# Patient Record
Sex: Male | Born: 1935 | Race: White | Hispanic: No | Marital: Married | Smoking: Former smoker
Health system: Southern US, Community
[De-identification: ages and names within clinical notes are randomized; demographics above are authoritative.]

## PROBLEM LIST (undated history)

## (undated) DIAGNOSIS — C4490 Unspecified malignant neoplasm of skin, unspecified: Secondary | ICD-10-CM

## (undated) DIAGNOSIS — Z8619 Personal history of other infectious and parasitic diseases: Secondary | ICD-10-CM

## (undated) HISTORY — DX: Personal history of other infectious and parasitic diseases: Z86.19

## (undated) HISTORY — DX: Unspecified malignant neoplasm of skin, unspecified: C44.90

---
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
## 2004-04-06 ENCOUNTER — Emergency Department (HOSPITAL_COMMUNITY): Admission: EM | Admit: 2004-04-06 | Discharge: 2004-04-06 | Payer: Self-pay | Admitting: Emergency Medicine

## 2011-06-25 DIAGNOSIS — D485 Neoplasm of uncertain behavior of skin: Secondary | ICD-10-CM | POA: Diagnosis not present

## 2011-06-25 DIAGNOSIS — L821 Other seborrheic keratosis: Secondary | ICD-10-CM | POA: Diagnosis not present

## 2011-06-25 DIAGNOSIS — L570 Actinic keratosis: Secondary | ICD-10-CM | POA: Diagnosis not present

## 2011-12-16 DIAGNOSIS — Z23 Encounter for immunization: Secondary | ICD-10-CM | POA: Diagnosis not present

## 2011-12-25 DIAGNOSIS — D485 Neoplasm of uncertain behavior of skin: Secondary | ICD-10-CM | POA: Diagnosis not present

## 2011-12-25 DIAGNOSIS — L821 Other seborrheic keratosis: Secondary | ICD-10-CM | POA: Diagnosis not present

## 2012-01-13 DIAGNOSIS — D485 Neoplasm of uncertain behavior of skin: Secondary | ICD-10-CM | POA: Diagnosis not present

## 2012-01-13 DIAGNOSIS — L821 Other seborrheic keratosis: Secondary | ICD-10-CM | POA: Diagnosis not present

## 2012-02-08 DIAGNOSIS — L908 Other atrophic disorders of skin: Secondary | ICD-10-CM | POA: Diagnosis not present

## 2012-02-08 DIAGNOSIS — C44111 Basal cell carcinoma of skin of unspecified eyelid, including canthus: Secondary | ICD-10-CM | POA: Diagnosis not present

## 2012-02-08 DIAGNOSIS — Z85828 Personal history of other malignant neoplasm of skin: Secondary | ICD-10-CM | POA: Diagnosis not present

## 2012-02-08 DIAGNOSIS — L905 Scar conditions and fibrosis of skin: Secondary | ICD-10-CM | POA: Diagnosis not present

## 2012-05-24 DIAGNOSIS — L570 Actinic keratosis: Secondary | ICD-10-CM | POA: Diagnosis not present

## 2012-05-24 DIAGNOSIS — Z85828 Personal history of other malignant neoplasm of skin: Secondary | ICD-10-CM | POA: Diagnosis not present

## 2012-05-24 DIAGNOSIS — L821 Other seborrheic keratosis: Secondary | ICD-10-CM | POA: Diagnosis not present

## 2012-08-19 DIAGNOSIS — H2510 Age-related nuclear cataract, unspecified eye: Secondary | ICD-10-CM | POA: Diagnosis not present

## 2012-12-15 DIAGNOSIS — Z23 Encounter for immunization: Secondary | ICD-10-CM | POA: Diagnosis not present

## 2013-04-26 ENCOUNTER — Ambulatory Visit: Payer: Self-pay | Admitting: Physician Assistant

## 2013-04-26 DIAGNOSIS — S91309A Unspecified open wound, unspecified foot, initial encounter: Secondary | ICD-10-CM | POA: Diagnosis not present

## 2013-05-10 DIAGNOSIS — Z125 Encounter for screening for malignant neoplasm of prostate: Secondary | ICD-10-CM | POA: Diagnosis not present

## 2013-05-10 DIAGNOSIS — Z136 Encounter for screening for cardiovascular disorders: Secondary | ICD-10-CM | POA: Diagnosis not present

## 2013-05-10 DIAGNOSIS — R413 Other amnesia: Secondary | ICD-10-CM | POA: Diagnosis not present

## 2013-05-10 DIAGNOSIS — Z13220 Encounter for screening for lipoid disorders: Secondary | ICD-10-CM | POA: Diagnosis not present

## 2013-05-10 LAB — CBC AND DIFFERENTIAL
HEMATOCRIT: 43 % (ref 41–53)
HEMOGLOBIN: 14.1 g/dL (ref 13.5–17.5)
Platelets: 154 10*3/uL (ref 150–399)
WBC: 7.8 10^3/mL

## 2013-05-10 LAB — LIPID PANEL
CHOLESTEROL: 200 mg/dL (ref 0–200)
HDL: 39 mg/dL (ref 35–70)
LDL Cholesterol: 137 mg/dL
Triglycerides: 119 mg/dL (ref 40–160)

## 2013-05-10 LAB — PSA: PSA: 4

## 2013-05-10 LAB — TSH: TSH: 6.25 u[IU]/mL — AB (ref 0.41–5.90)

## 2013-05-12 ENCOUNTER — Ambulatory Visit: Payer: Self-pay | Admitting: Family Medicine

## 2013-05-12 DIAGNOSIS — R413 Other amnesia: Secondary | ICD-10-CM | POA: Diagnosis not present

## 2013-05-12 LAB — CREATININE, SERUM
Creatinine: 1.15 mg/dL (ref 0.60–1.30)
EGFR (African American): >60
EGFR (Non-African Amer.): >60

## 2013-06-12 DIAGNOSIS — R413 Other amnesia: Secondary | ICD-10-CM | POA: Diagnosis not present

## 2013-09-12 DIAGNOSIS — R413 Other amnesia: Secondary | ICD-10-CM | POA: Diagnosis not present

## 2013-10-16 DIAGNOSIS — F0390 Unspecified dementia without behavioral disturbance: Secondary | ICD-10-CM | POA: Diagnosis not present

## 2013-12-14 DIAGNOSIS — Z23 Encounter for immunization: Secondary | ICD-10-CM | POA: Diagnosis not present

## 2014-01-29 DIAGNOSIS — H2513 Age-related nuclear cataract, bilateral: Secondary | ICD-10-CM | POA: Diagnosis not present

## 2014-04-11 DIAGNOSIS — F0280 Dementia in other diseases classified elsewhere without behavioral disturbance: Secondary | ICD-10-CM | POA: Diagnosis not present

## 2014-04-11 DIAGNOSIS — G301 Alzheimer's disease with late onset: Secondary | ICD-10-CM | POA: Diagnosis not present

## 2014-04-11 DIAGNOSIS — F840 Autistic disorder: Secondary | ICD-10-CM | POA: Diagnosis not present

## 2014-04-11 DIAGNOSIS — F0390 Unspecified dementia without behavioral disturbance: Secondary | ICD-10-CM | POA: Diagnosis not present

## 2014-04-19 DIAGNOSIS — F840 Autistic disorder: Secondary | ICD-10-CM | POA: Insufficient documentation

## 2014-04-19 DIAGNOSIS — F0280 Dementia in other diseases classified elsewhere without behavioral disturbance: Secondary | ICD-10-CM | POA: Insufficient documentation

## 2014-04-19 DIAGNOSIS — G309 Alzheimer's disease, unspecified: Secondary | ICD-10-CM

## 2014-06-13 DIAGNOSIS — G309 Alzheimer's disease, unspecified: Secondary | ICD-10-CM | POA: Diagnosis not present

## 2014-06-13 DIAGNOSIS — E538 Deficiency of other specified B group vitamins: Secondary | ICD-10-CM | POA: Diagnosis not present

## 2014-06-13 DIAGNOSIS — F0280 Dementia in other diseases classified elsewhere without behavioral disturbance: Secondary | ICD-10-CM | POA: Diagnosis not present

## 2014-06-13 DIAGNOSIS — E038 Other specified hypothyroidism: Secondary | ICD-10-CM | POA: Diagnosis not present

## 2014-06-13 DIAGNOSIS — F0281 Dementia in other diseases classified elsewhere with behavioral disturbance: Secondary | ICD-10-CM | POA: Diagnosis not present

## 2014-06-13 DIAGNOSIS — F329 Major depressive disorder, single episode, unspecified: Secondary | ICD-10-CM | POA: Diagnosis not present

## 2014-06-21 ENCOUNTER — Emergency Department
Admit: 2014-06-21 | Disposition: A | Discharge status: Home / Self Care | Payer: Self-pay | Admitting: Emergency Medicine

## 2014-06-21 DIAGNOSIS — F0390 Unspecified dementia without behavioral disturbance: Secondary | ICD-10-CM | POA: Diagnosis not present

## 2014-06-21 DIAGNOSIS — Z79899 Other long term (current) drug therapy: Secondary | ICD-10-CM | POA: Diagnosis not present

## 2014-06-21 DIAGNOSIS — Z87891 Personal history of nicotine dependence: Secondary | ICD-10-CM | POA: Diagnosis not present

## 2014-08-06 DIAGNOSIS — H40003 Preglaucoma, unspecified, bilateral: Secondary | ICD-10-CM | POA: Diagnosis not present

## 2014-08-13 DIAGNOSIS — H40003 Preglaucoma, unspecified, bilateral: Secondary | ICD-10-CM | POA: Diagnosis not present

## 2014-12-05 DIAGNOSIS — Z23 Encounter for immunization: Secondary | ICD-10-CM | POA: Diagnosis not present

## 2015-01-16 DIAGNOSIS — C44601 Unspecified malignant neoplasm of skin of unspecified upper limb, including shoulder: Secondary | ICD-10-CM | POA: Insufficient documentation

## 2015-01-16 DIAGNOSIS — C44101 Unspecified malignant neoplasm of skin of unspecified eyelid, including canthus: Secondary | ICD-10-CM | POA: Insufficient documentation

## 2015-01-16 DIAGNOSIS — E785 Hyperlipidemia, unspecified: Secondary | ICD-10-CM | POA: Insufficient documentation

## 2015-01-16 DIAGNOSIS — M1990 Unspecified osteoarthritis, unspecified site: Secondary | ICD-10-CM | POA: Insufficient documentation

## 2015-01-17 ENCOUNTER — Encounter: Payer: Self-pay | Admitting: Family Medicine

## 2015-01-17 ENCOUNTER — Ambulatory Visit (INDEPENDENT_AMBULATORY_CARE_PROVIDER_SITE_OTHER): Payer: Medicare Other | Admitting: Family Medicine

## 2015-01-17 VITALS — BP 148/60 | HR 60 | Temp 97.1°F | Resp 16 | Ht 73.0 in | Wt 167.2 lb

## 2015-01-17 DIAGNOSIS — Z111 Encounter for screening for respiratory tuberculosis: Secondary | ICD-10-CM

## 2015-01-17 DIAGNOSIS — Z0000 Encounter for general adult medical examination without abnormal findings: Secondary | ICD-10-CM

## 2015-01-17 DIAGNOSIS — F0280 Dementia in other diseases classified elsewhere without behavioral disturbance: Secondary | ICD-10-CM

## 2015-01-17 DIAGNOSIS — Z23 Encounter for immunization: Secondary | ICD-10-CM | POA: Diagnosis not present

## 2015-01-17 DIAGNOSIS — G301 Alzheimer's disease with late onset: Secondary | ICD-10-CM | POA: Diagnosis not present

## 2015-01-17 NOTE — Progress Notes (Deleted)
Name: Stephen Lewis.   MRN: [id number]    DOB: Jun 06, 1932   Date:01/17/2015       Progress Note  Subjective  Chief Complaint  Chief Complaint  Patient presents with  . Medicare Wellness    HPI  *** No problem-specific assessment & plan notes found for this encounter.   Past Medical History  Diagnosis Date  . Skin cancer   . History of measles     Past Surgical History  Procedure Laterality Date  . Appendectomy  1951    Family History  Problem Relation Age of Onset  . Heart Problems Father   . Cirrhosis Sister   . Alzheimer's disease Brother     Social History   Social History  . Marital Status: Married    Spouse Name: N/A  . Number of Children: N/A  . Years of Education: N/A   Occupational History  . Retired     previously worked in Comptroller   Social History Main Topics  . Smoking status: Former Research scientist (life sciences)  . Smokeless tobacco: Not on file  . Alcohol Use: No  . Drug Use: No  . Sexual Activity: Not on file   Other Topics Concern  . Not on file   Social History Narrative     Current outpatient prescriptions:  .  donepezil (ARICEPT) 10 MG tablet, Take 1 tablet by mouth. At night, Disp: , Rfl:  .  memantine (NAMENDA XR) 28 MG CP24 24 hr capsule, Take 1 tablet by mouth daily., Disp: , Rfl:   No Known Allergies   ROS  ***  Objective  Filed Vitals:   01/17/15 1034  BP: 148/60  Pulse: 60  Temp: 97.1 F (36.2 C)  TempSrc: Oral  Resp: 16  Height: 6\' 1"  (1.854 m)  Weight: 167 lb 3.2 oz (75.841 kg)    Physical Exam   ***  No results found for this or any previous visit (from the past 2160 hour(s)).   Assessment & Plan  Problem List Items Addressed This Visit    None      No orders of the defined types were placed in this encounter.

## 2015-01-17 NOTE — Progress Notes (Deleted)
Name: Stephen Lewis.   MRN: [id number]    DOB: Oct 16, 1932   Date:01/17/2015       Progress Note  Subjective  Chief Complaint  Chief Complaint  Patient presents with  . Medicare Wellness    HPI  *** No problem-specific assessment & plan notes found for this encounter.   Past Medical History  Diagnosis Date  . Skin cancer   . History of measles     Past Surgical History  Procedure Laterality Date  . Appendectomy  1951    Family History  Problem Relation Age of Onset  . Heart Problems Father   . Cirrhosis Sister   . Alzheimer's disease Brother     Social History   Social History  . Marital Status: Married    Spouse Name: N/A  . Number of Children: N/A  . Years of Education: N/A   Occupational History  . Retired     previously worked in Comptroller   Social History Main Topics  . Smoking status: Former Research scientist (life sciences)  . Smokeless tobacco: Not on file  . Alcohol Use: No  . Drug Use: No  . Sexual Activity: Not on file   Other Topics Concern  . Not on file   Social History Narrative     Current outpatient prescriptions:  .  donepezil (ARICEPT) 10 MG tablet, Take 1 tablet by mouth. At night, Disp: , Rfl:  .  memantine (NAMENDA XR) 28 MG CP24 24 hr capsule, Take 1 tablet by mouth daily., Disp: , Rfl:   No Known Allergies   ROS  ***  Objective  Filed Vitals:   01/17/15 1034  BP: 148/60  Pulse: 60  Temp: 97.1 F (36.2 C)  TempSrc: Oral  Resp: 16  Height: 6\' 1"  (1.854 m)  Weight: 167 lb 3.2 oz (75.841 kg)    Physical Exam   ***  No results found for this or any previous visit (from the past 2160 hour(s)).   Assessment & Plan  Problem List Items Addressed This Visit    None      No orders of the defined types were placed in this encounter.

## 2015-01-17 NOTE — Progress Notes (Signed)
Patient: Stephen Lewis., Male    DOB: 08-15-1932, 81 y.o.   MRN: [id number] Visit Date: 01/17/2015  Today's Provider: Lelon Huh, MD   Chief Complaint  Patient presents with  . Medicare Wellness   Subjective:    Annual wellness visit [name]. is a 81 y.o. male. He feels well. He reports exercising walks sometimes. He reports he is sleeping well.  -----------------------------------------------------------   Dementia: He had been going to Memory Care at [facility] and was given prescription for sertraline at last visit this spring, but his wife reports she never filled prescription because she feel he was depressed. He continues on Aricept and Namenca but his wife is not sure that it is helping much.  They are in the process of admitting him to an assisted living facility. Aside from dementia he is getting along well. Is ambulating without difficulty. No bowel or bladder problems.    Review of Systems  Constitutional: Positive for activity change.  HENT: Negative.   Eyes: Negative.   Respiratory: Negative.   Cardiovascular: Negative.   Gastrointestinal: Negative.   Endocrine: Positive for cold intolerance.  Neurological: Negative for dizziness, tremors, seizures, weakness, light-headedness, numbness and headaches.  Psychiatric/Behavioral: Positive for behavioral problems, confusion, decreased concentration and agitation.    Social History   Social History  . Marital Status: Married    Spouse Name: N/A  . Number of Children: N/A  . Years of Education: N/A   Occupational History  . Retired     previously worked in Comptroller   Social History Main Topics  . Smoking status: Former Research scientist (life sciences)  . Smokeless tobacco: Not on file  . Alcohol Use: No  . Drug Use: No  . Sexual Activity: Not on file   Other Topics Concern  . Not on file   Social History Narrative    Patient Active Problem List   Diagnosis Date Noted  . Arthritis 01/16/2015  .  Hyperlipidemia 01/16/2015  . Cancer of skin, upper limb 01/16/2015  . Cancer of skin of eyelid 01/16/2015  . Dementia in Alzheimer's disease with late onset 04/19/2014  . Autism spectrum 04/19/2014    Past Surgical History  Procedure Laterality Date  . Appendectomy  1951    His family history includes Alzheimer's disease in his brother; Cirrhosis in his sister; Heart Problems in his father.    Previous Medications   DONEPEZIL (ARICEPT) 10 MG TABLET    Take 1 tablet by mouth. At night   MEMANTINE (NAMENDA XR) 28 MG CP24 24 HR CAPSULE    Take 1 tablet by mouth daily.    Patient Care Team: Birdie Sons, MD as PCP - General (Family Medicine) Lysle Morales, MD as Consulting Physician (Neurology)     Objective:   Vitals: BP 148/60 mmHg  Pulse 60  Temp(Src) 97.1 F (36.2 C) (Oral)  Resp 16  Ht 6\' 1"  (1.854 m)  Wt 167 lb 3.2 oz (75.841 kg)  BMI 22.06 kg/m2  Physical Exam  General appearance: alert, well developed, well nourished, cooperative and in no distress Head: Normocephalic, without obvious abnormality, atraumatic Lungs: Respirations even and unlabored Extremities: No gross deformities Skin: Skin color, texture, turgor normal. No rashes seen  Psych: Appropriate mood and affect. Neurologic: Mental status: Alert, oriented to person, place, and time, thought content appropriate.  Activities of Daily Living In your present state of health, do you have any difficulty performing the following activities: 01/17/2015  Hearing? N  Vision? N  Difficulty concentrating or making decisions? Y  Walking or climbing stairs? Y  Dressing or bathing? N  Doing errands, shopping? Y    Fall Risk Assessment Fall Risk  01/17/2015  Falls in the past year? No     Depression Screen PHQ 2/9 Scores 01/17/2015  PHQ - 2 Score 1    Cognitive Testing - 6-CIT  Correct? Score   What year is it? no 4 0 or 4  What month is it? no 3 0 or 3  Memorize:    Stephen Lewis,  [birth year],  Stephen Lewis,      What time is it? (within 1 hour) yes 0 0 or 3  Count backwards from 20 yes 0 0, 2, or 4  Name the months of the year yes 0 0, 2, or 4  Repeat name & address above no 10 0, 2, 4, 6, 8, or 10       TOTAL SCORE  17/28   Interpretation:  Abnormal- 17/28  Normal (0-7) Abnormal (8-28)   Audit-C Alcohol Use Screening  Question Answer Points  How often do you have alcoholic drink? never 0  On days you do drink alcohol, how many drinks do you typically consume? 0 0  How oftey will you drink 6 or more in a total? never 0  Total Score:  0   A score of 3 or more in women, and 4 or more in men indicates increased risk for alcohol abuse, EXCEPT if all of the points are from question 1.     Assessment & Plan:     Annual Wellness Visit  Reviewed patient's Family Medical History Reviewed and updated list of patient's medical providers Assessment of cognitive impairment was done Assessed patient's functional ability Established a written schedule for health screening [place] Completed and Reviewed  Exercise Activities and Dietary recommendations Goals    None       There is no immunization history on file for this patient.  Health Maintenance  Topic Date Due  . TETANUS/TDAP  07/14/1953  . ZOSTAVAX  07/15/1994  . PNA vac Low Risk Adult (1 of 2 - PCV13) 07/15/1999  . INFLUENZA VACCINE  10/01/2014      Discussed health benefits of physical activity, and encouraged him to engage in regular exercise appropriate for his age and condition.    ------------------------------------------------------------------------------------------------------------  1. Medicare annual wellness visit, subsequent   2. Dementia in Alzheimer's disease with late onset Continue namenda and aricept. She does reports he tends to get agitated and counseled her that sertraline was prescribed by neurologist to help with this. She still has prescription and will  reconsider filling it.   3. Screening-pulmonary TB Will be going to an assisted living facility. - Quantiferon tb gold assay - QuantiFERON In Tube  4. Need for pneumococcal vaccination  - Pneumococcal conjugate vaccine 13-valent IM

## 2015-01-20 LAB — QUANTIFERON IN TUBE
QUANTIFERON MITOGEN VALUE: 0.36 IU/mL
QUANTIFERON NIL VALUE: 0.07 [IU]/mL
QUANTIFERON TB AG VALUE: 0.06 [IU]/mL
QUANTIFERON TB GOLD: UNDETERMINED — AB

## 2015-01-20 LAB — QUANTIFERON TB GOLD ASSAY (BLOOD)

## 2015-01-21 ENCOUNTER — Telehealth: Payer: Self-pay | Admitting: Family Medicine

## 2015-01-21 DIAGNOSIS — Z111 Encounter for screening for respiratory tuberculosis: Secondary | ICD-10-CM

## 2015-01-21 NOTE — Telephone Encounter (Signed)
Pt's wife Darryll Capers stated she was returning Michelle's call. Thanks TNP

## 2015-01-22 NOTE — Telephone Encounter (Signed)
-----   Message from Birdie Sons, MD sent at 01/21/2015  8:14 AM EST ----- TB blood test is indeterminate. He needs chest XR to rule out latent TB.

## 2015-01-23 ENCOUNTER — Ambulatory Visit
Admission: RE | Admit: 2015-01-23 | Discharge: 2015-01-23 | Disposition: A | Discharge status: Home / Self Care | Payer: Medicare Other | Source: Ambulatory Visit | Attending: Family Medicine | Admitting: Family Medicine

## 2015-01-23 DIAGNOSIS — Z111 Encounter for screening for respiratory tuberculosis: Secondary | ICD-10-CM | POA: Insufficient documentation

## 2015-01-23 DIAGNOSIS — R7611 Nonspecific reaction to tuberculin skin test without active tuberculosis: Secondary | ICD-10-CM | POA: Diagnosis not present

## 2015-01-23 NOTE — Telephone Encounter (Signed)
Patient's wife was notified of results. Expressed understanding. X-ray ordered.

## 2015-02-11 DIAGNOSIS — H40003 Preglaucoma, unspecified, bilateral: Secondary | ICD-10-CM | POA: Diagnosis not present

## 2015-03-12 IMAGING — CT CT HEAD WITHOUT AND WITH CONTRAST
1 of 2 series · 13 of 30 positions shown, 17 images · IV contrast (agent unspecified)
Comparison: None.

CLINICAL DATA: Memory impairment, worsening last 6 months. History
periorbital skin carcinoma.

EXAM:
CT HEAD WITHOUT AND WITH CONTRAST
TECHNIQUE: Contiguous axial images were obtained from the base of the skull
through the vertex without and with intravenous contrast
CONTRAST:  75 mm of [id number] intravenous contrast

[Series 2: head wo · axial · 0.43mm/px · z∈[-66,+54]mm · 13 of 29 slices shown, 17 images]
[im 3/29  brain]
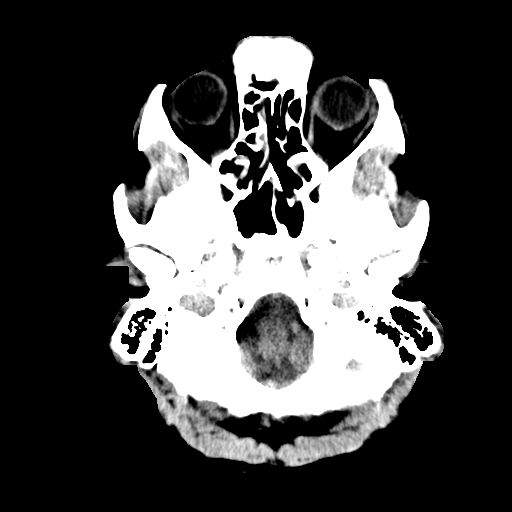
[im 3/29  bone]
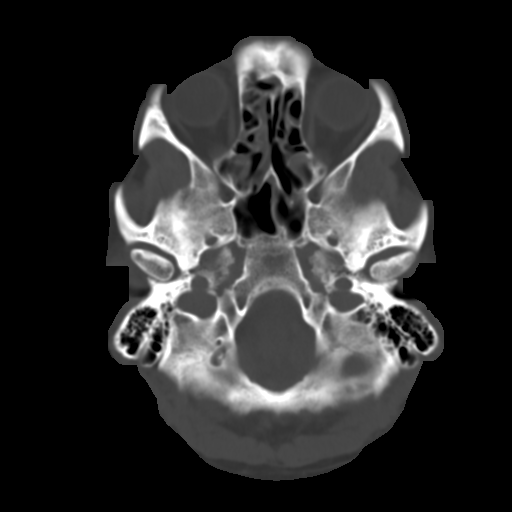
[im 5/29  brain]
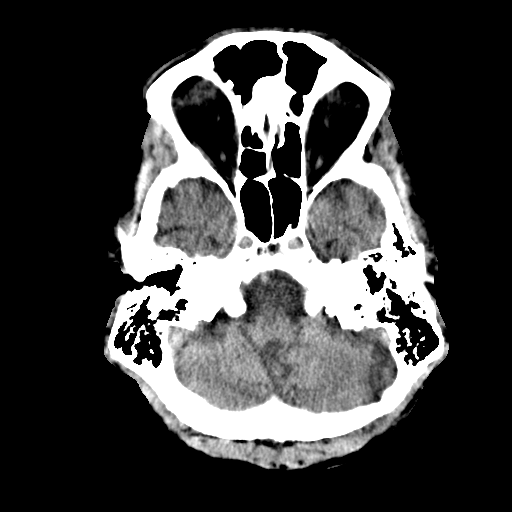
[im 7/29  brain]
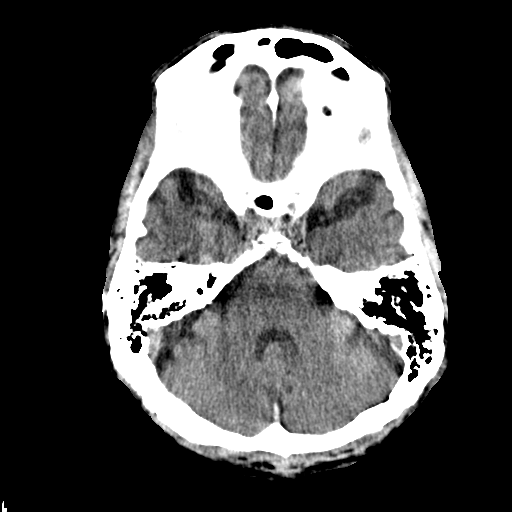
[im 9/29  brain]
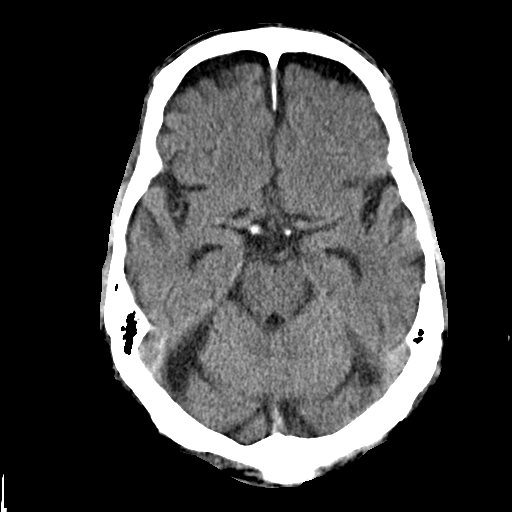
[im 11/29  brain]
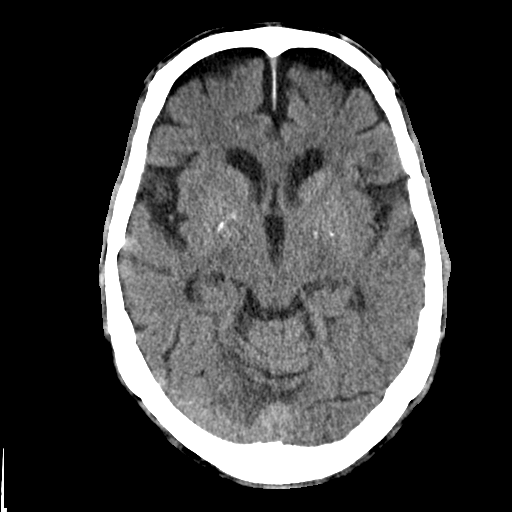
[im 11/29  bone]
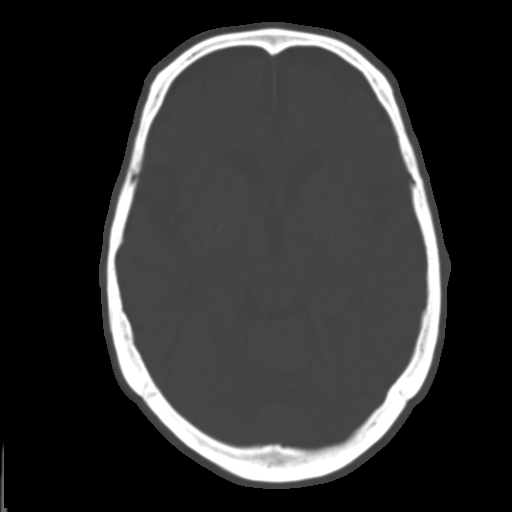
[im 13/29  brain]
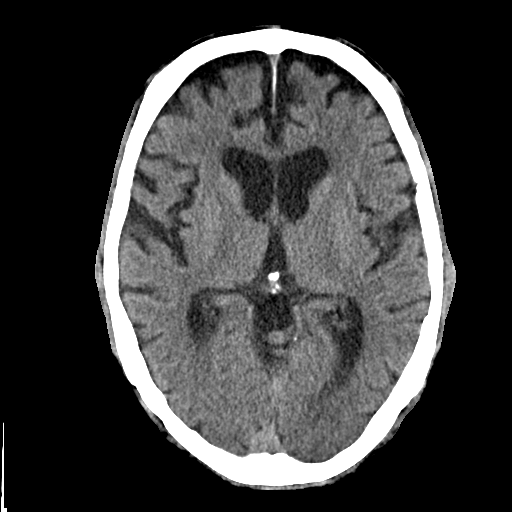
[im 15/29  brain]
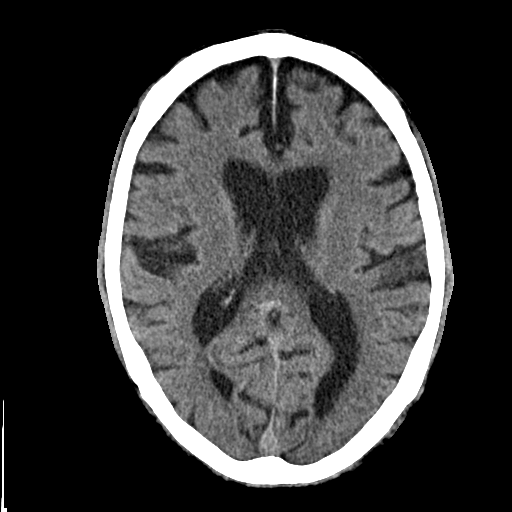
[im 17/29  brain]
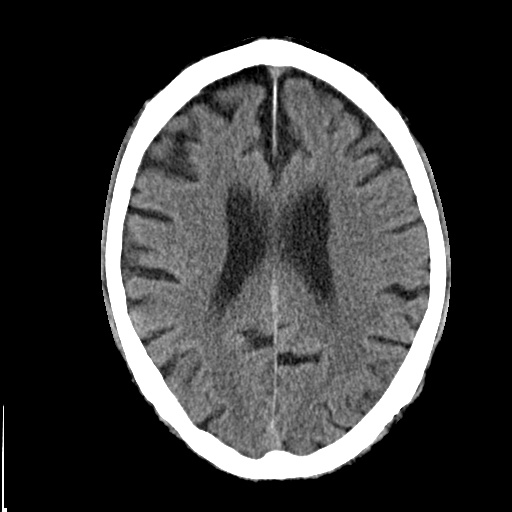
[im 19/29  brain]
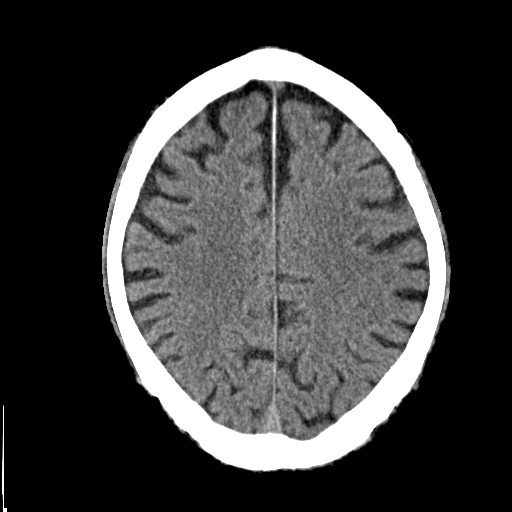
[im 19/29  bone]
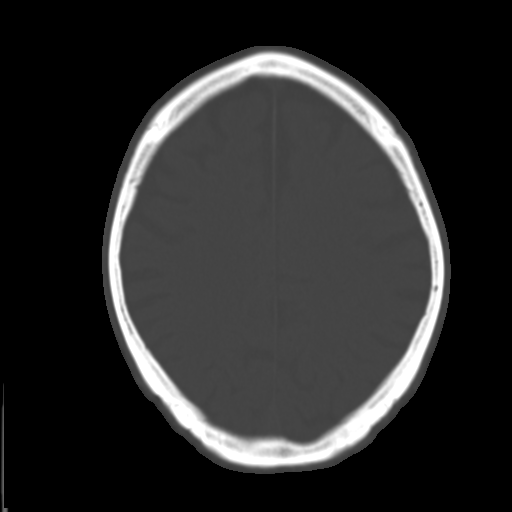
[im 21/29  brain]
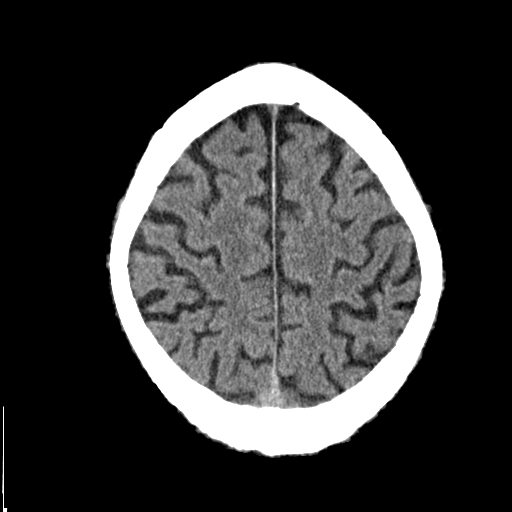
[im 23/29  brain]
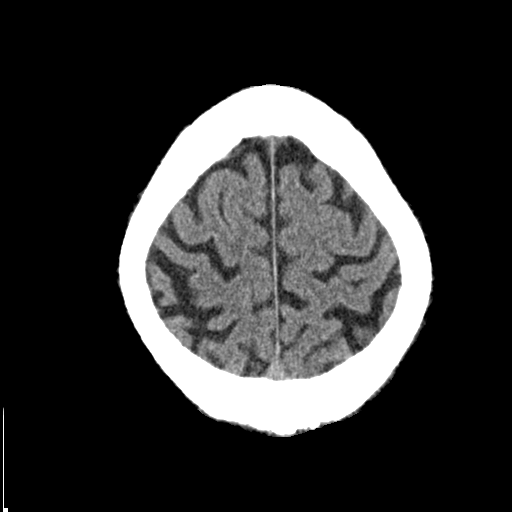
[im 25/29  brain]
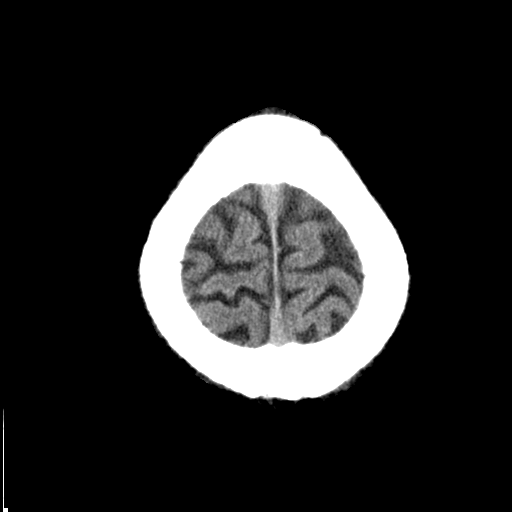
[im 27/29  brain]
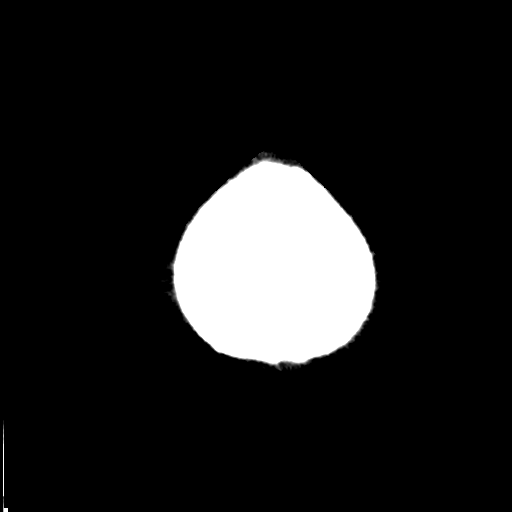
[im 27/29  bone]
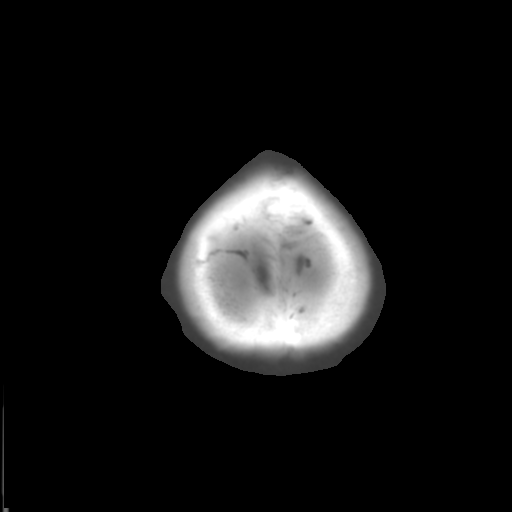

[13 of 30 positions shown; findings below may reference images not displayed]

FINDINGS: Ventricles are normal in configuration. There is ventricular and
sulcal enlargement reflecting moderate atrophy. There are no
parenchymal masses or mass effect. There is no evidence of a
cortical infarct. There are no extra-axial masses or abnormal fluid
collections.

There are no areas of abnormal parenchymal or extra-axial
enhancement. Normal enhancement is seen along the skullbase
vasculature.

No skull lesions. There is mild maxillary and ethmoid sinus mucosal
thickening.
IMPRESSION: 1. No acute intracranial abnormalities.
2. No masses or abnormal enhancement. No evidence of metastatic
disease.
3. Moderate atrophy.
4. Mild ethmoid and maxillary sinus mucosal thickening.

## 2015-05-08 ENCOUNTER — Encounter: Payer: Self-pay | Admitting: *Deleted

## 2015-06-14 ENCOUNTER — Telehealth: Payer: Self-pay | Admitting: Family Medicine

## 2015-06-14 ENCOUNTER — Ambulatory Visit: Payer: Medicare Other | Admitting: Family Medicine

## 2015-06-18 ENCOUNTER — Ambulatory Visit (INDEPENDENT_AMBULATORY_CARE_PROVIDER_SITE_OTHER): Payer: Medicare Other | Admitting: Family Medicine

## 2015-06-18 ENCOUNTER — Encounter: Payer: Self-pay | Admitting: Family Medicine

## 2015-06-18 VITALS — BP 124/68 | HR 60 | Temp 98.0°F | Resp 16 | Wt 168.0 lb

## 2015-06-18 DIAGNOSIS — F0280 Dementia in other diseases classified elsewhere without behavioral disturbance: Secondary | ICD-10-CM

## 2015-06-18 DIAGNOSIS — N390 Urinary tract infection, site not specified: Secondary | ICD-10-CM | POA: Insufficient documentation

## 2015-06-18 DIAGNOSIS — N3000 Acute cystitis without hematuria: Secondary | ICD-10-CM | POA: Diagnosis not present

## 2015-06-18 DIAGNOSIS — G301 Alzheimer's disease with late onset: Secondary | ICD-10-CM | POA: Diagnosis not present

## 2015-06-18 LAB — POCT URINALYSIS DIPSTICK
BILIRUBIN UA: NEGATIVE
Glucose, UA: NEGATIVE
Ketones, UA: NEGATIVE
NITRITE UA: POSITIVE
PH UA: 7.5
Spec Grav, UA: 1.01
UROBILINOGEN UA: 0.2

## 2015-06-18 MED ORDER — MEMANTINE HCL ER 28 MG PO CP24: 28.0000 mg | ORAL_CAPSULE | Freq: Every day | ORAL | Status: DC

## 2015-06-18 MED ORDER — CIPROFLOXACIN HCL 500 MG PO TABS: 500.0000 mg | ORAL_TABLET | Freq: Two times a day (BID) | ORAL | Status: AC

## 2015-06-18 MED ORDER — DONEPEZIL HCL 10 MG PO TABS: 10.0000 mg | ORAL_TABLET | Freq: Every day | ORAL | Status: DC

## 2015-06-18 NOTE — Progress Notes (Signed)
Patient ID: Stephen Smedberg., male   DOB: 14-Feb-1933, 82 y.o.   MRN: [id number]       Patient: Stephen Lewis. Male    DOB: June 11, 1932   82 y.o.   MRN: [id number] Visit Date: 06/18/2015  Today's Provider: Lelon Huh, MD   Chief Complaint  Patient presents with  . Urinary Tract Infection   Subjective:    Urinary Tract Infection  This is a new problem. The current episode started in the past 7 days. The problem occurs intermittently. The problem has been unchanged. The patient is experiencing no pain. There has been no fever. Associated symptoms include frequency. Pertinent negatives include no flank pain, hematuria, nausea, urgency or vomiting. He has tried nothing for the symptoms.   Follow up Alzheimers His wife reports slow decline in memory, but is still functioning fairly well. Is tolerating current medications, and wishes to continue.     No Known Allergies Previous Medications   DONEPEZIL (ARICEPT) 10 MG TABLET    Take 1 tablet by mouth. At night   MEMANTINE (NAMENDA XR) 28 MG CP24 24 HR CAPSULE    Take 1 tablet by mouth daily.    Review of Systems  Constitutional: Negative.   Gastrointestinal: Negative for nausea and vomiting.  Genitourinary: Positive for frequency. Negative for dysuria, urgency, hematuria, flank pain and difficulty urinating.    Social History  Substance Use Topics  . Smoking status: Former Research scientist (life sciences)  . Smokeless tobacco: Not on file  . Alcohol Use: No   Objective:   BP 124/68 mmHg  Pulse 60  Wt 168 lb (76.204 kg)  Physical Exam  General Appearance:    Alert, cooperative, no distress  Eyes:    PERRL, conjunctiva/corneas clear, EOM's intact       Lungs:     Clear to auscultation bilaterally, respirations unlabored  Heart:    Regular rate and rhythm  Abdomen:   . No CVA tenderness     Results for orders placed or performed in visit on 06/18/15  POCT urinalysis dipstick  Result Value Ref Range   Color, UA yellow    Clarity, UA cloudy    Glucose, UA negative    Bilirubin, UA negative    Ketones, UA negative    Spec Grav, UA 1.010    Blood, UA small    pH, UA 7.5    Protein, UA trace    Urobilinogen, UA 0.2    Nitrite, UA positive    Leukocytes, UA large (3+) (A) Negative        Assessment & Plan:     1. Acute cystitis without hematuria  - ciprofloxacin (CIPRO) 500 MG tablet; Take 1 tablet (500 mg total) by mouth 2 (two) times daily.  Dispense: 20 tablet; Refill: 0 - Urine culture - POCT urinalysis dipstick  Call if symptoms change or if not rapidly improving.     2. Dementia in Alzheimer's disease with late onset  - donepezil (ARICEPT) 10 MG tablet; Take 1 tablet (10 mg total) by mouth daily. At night  Dispense: 30 tablet; Refill: 5 - memantine (NAMENDA XR) 28 MG CP24 24 hr capsule; Take 1 capsule (28 mg total) by mouth daily.  Dispense: 30 capsule; Refill: [place], MD  [facility]

## 2015-06-20 LAB — URINE CULTURE

## 2015-08-12 DIAGNOSIS — H40003 Preglaucoma, unspecified, bilateral: Secondary | ICD-10-CM | POA: Diagnosis not present

## 2015-09-20 DIAGNOSIS — H40003 Preglaucoma, unspecified, bilateral: Secondary | ICD-10-CM | POA: Diagnosis not present

## 2015-09-27 ENCOUNTER — Ambulatory Visit (INDEPENDENT_AMBULATORY_CARE_PROVIDER_SITE_OTHER): Payer: Medicare Other | Admitting: Family Medicine

## 2015-09-27 ENCOUNTER — Encounter: Payer: Self-pay | Admitting: Family Medicine

## 2015-09-27 VITALS — BP 122/64 | HR 56 | Temp 98.0°F | Resp 16 | Wt 164.0 lb

## 2015-09-27 DIAGNOSIS — G301 Alzheimer's disease with late onset: Secondary | ICD-10-CM | POA: Diagnosis not present

## 2015-09-27 DIAGNOSIS — N390 Urinary tract infection, site not specified: Secondary | ICD-10-CM

## 2015-09-27 DIAGNOSIS — R350 Frequency of micturition: Secondary | ICD-10-CM | POA: Diagnosis not present

## 2015-09-27 DIAGNOSIS — F0280 Dementia in other diseases classified elsewhere without behavioral disturbance: Secondary | ICD-10-CM

## 2015-09-27 LAB — POCT URINALYSIS DIPSTICK
BILIRUBIN UA: NEGATIVE
GLUCOSE UA: NEGATIVE
Ketones, UA: NEGATIVE
NITRITE UA: NEGATIVE
PH UA: 6.5
Protein, UA: NEGATIVE
Spec Grav, UA: <=1.005
Urobilinogen, UA: 0.2

## 2015-09-27 NOTE — Progress Notes (Signed)
Patient: Stephen Lewis. Male    DOB: January 24, 1933   82 y.o.   MRN: [id number] Visit Date: 09/27/2015  Today's Provider: Vernie Murders, PA   Chief Complaint  Patient presents with  . Urinary Frequency   Subjective:    Urinary Frequency   This is a new problem. The current episode started in the past 7 days. The problem has been unchanged. The patient is experiencing no pain. Associated symptoms include frequency. Pertinent negatives include no chills, discharge, flank pain, hematuria, hesitancy, nausea, sweats, urgency or vomiting. He has tried NSAIDs for the symptoms. The treatment provided no relief.   Past Medical History:  Diagnosis Date  . History of measles   . Skin cancer    Patient Active Problem List   Diagnosis Date Noted  . Urinary tract infection 06/18/2015  . Arthritis 01/16/2015  . Hyperlipidemia 01/16/2015  . Cancer of skin, upper limb 01/16/2015  . Cancer of skin of eyelid 01/16/2015  . Dementia in Alzheimer's disease with late onset 04/19/2014  . Autism spectrum 04/19/2014   Past Surgical History:  Procedure Laterality Date  . APPENDECTOMY  1951   Family History  Problem Relation Age of Onset  . Heart Problems Father   . Cirrhosis Sister   . Alzheimer's disease Brother       No Known Allergies Current Meds  Medication Sig  . donepezil (ARICEPT) 10 MG tablet Take 1 tablet (10 mg total) by mouth daily. At night  . memantine (NAMENDA XR) 28 MG CP24 24 hr capsule Take 1 capsule (28 mg total) by mouth daily.    Review of Systems  Constitutional: Negative for appetite change, chills and fever.  Respiratory: Negative for chest tightness, shortness of breath and wheezing.   Cardiovascular: Negative for chest pain and palpitations.  Gastrointestinal: Negative for abdominal pain, nausea and vomiting.  Genitourinary: Positive for frequency. Negative for flank pain, hematuria, hesitancy and urgency.    Social History  Substance Use Topics  .  Smoking status: Former Research scientist (life sciences)  . Smokeless tobacco: Former Systems developer  . Alcohol use No   Objective:   BP 122/64   Pulse (!) 56   Temp 98 F (36.7 C) (Oral)   Resp 16   Wt 164 lb (74.4 kg)   SpO2 97% Comment: room air  BMI 21.64 kg/m  Wt Readings from Last 3 Encounters:  09/27/15 164 lb (74.4 kg)  06/18/15 168 lb (76.2 kg)  01/17/15 167 lb 3.2 oz (75.8 kg)    Physical Exam  Constitutional: He appears well-developed and well-nourished.  HENT:  Head: Normocephalic.  Right Ear: External ear normal.  Left Ear: External ear normal.  Nose: Nose normal.  Mouth/Throat: Oropharynx is clear and moist.  Eyes: Conjunctivae and EOM are normal.  Neck: Neck supple.  Cardiovascular: Normal rate and regular rhythm.   Pulmonary/Chest: Effort normal and breath sounds normal.  Abdominal: Soft. Bowel sounds are normal. There is no tenderness.  Lymphadenopathy:    He has no cervical adenopathy.  Neurological:  Moves all extremities well. Alert and cooperative with memory impairment.  Psychiatric: He has a normal mood and affect. His speech is normal and behavior is normal. Cognition and memory are impaired.      Assessment & Plan:     1. Urinary frequency No dysuria noted by patient or wife. Daycare staff felt he may be having a UTI. No hematuria, fever or pain. Urinalysis difficult to evaluate (did not collect enough urine). Given container  to take home and collect if any symptoms noticed by wife. - POCT Urinalysis Dipstick  2. Dementia in Alzheimer's disease with late onset Essentially unchanged confusion and memory impairment. Tolerating Namenda and Aricept daily. May continue adult daycare. Recheck prn.       Vernie Murders, PA  [facility]

## 2015-09-27 NOTE — Addendum Note (Signed)
Addended by: Randal Buba on: 09/27/2015 01:22 PM   Modules accepted: Orders

## 2015-09-28 LAB — URINE CULTURE

## 2015-10-16 NOTE — Telephone Encounter (Signed)
error 

## 2015-10-30 ENCOUNTER — Ambulatory Visit: Payer: Medicare Other | Admitting: Family Medicine

## 2015-10-31 ENCOUNTER — Ambulatory Visit (INDEPENDENT_AMBULATORY_CARE_PROVIDER_SITE_OTHER): Payer: Medicare Other | Admitting: Family Medicine

## 2015-10-31 ENCOUNTER — Encounter: Payer: Self-pay | Admitting: Family Medicine

## 2015-10-31 VITALS — BP 120/62 | HR 56 | Temp 98.7°F | Resp 16 | Wt 164.0 lb

## 2015-10-31 DIAGNOSIS — R350 Frequency of micturition: Secondary | ICD-10-CM | POA: Diagnosis not present

## 2015-10-31 LAB — POCT URINALYSIS DIPSTICK
BILIRUBIN UA: NEGATIVE
Blood, UA: NEGATIVE
GLUCOSE UA: NEGATIVE
KETONES UA: NEGATIVE
LEUKOCYTES UA: NEGATIVE
Nitrite, UA: NEGATIVE
Protein, UA: NEGATIVE
Spec Grav, UA: 1.01
Urobilinogen, UA: 0.2
pH, UA: 7

## 2015-10-31 LAB — GLUCOSE, POCT (MANUAL RESULT ENTRY): POC GLUCOSE: 99 mg/dL (ref 70–99)

## 2015-10-31 MED ORDER — CIPROFLOXACIN HCL 500 MG PO TABS: 500.0000 mg | ORAL_TABLET | Freq: Two times a day (BID) | ORAL | 0 refills | Status: AC

## 2015-10-31 NOTE — Progress Notes (Signed)
Patient: Stephen Lewis. Male    DOB: [birth year]   82 y.o.   MRN: [id number] Visit Date: 10/31/2015  Today's Provider: Lelon Huh, MD   Chief Complaint  Patient presents with  . Urinary Frequency   Subjective:    Patient here with wife [name], she reports that pt has been having accidents at home and at elder care.    Urinary Frequency   This is a new problem. The current episode started in the past 7 days. The problem has been unchanged. The patient is experiencing no pain. There has been no fever. There is no history of pyelonephritis. Associated symptoms include frequency. Pertinent negatives include no chills, nausea or vomiting. He has tried nothing for the symptoms.   He did have positive cultures with UTI symptoms In April. He had similar symptoms in July but had negative cultures. Denies any feeling of urgency or hesitancy, but is not very good historian.     No Known Allergies Current Meds  Medication Sig  . donepezil (ARICEPT) 10 MG tablet Take 1 tablet (10 mg total) by mouth daily. At night  . memantine (NAMENDA XR) 28 MG CP24 24 hr capsule Take 1 capsule (28 mg total) by mouth daily.    Review of Systems  Constitutional: Negative for appetite change, chills and fever.  Respiratory: Negative for chest tightness, shortness of breath and wheezing.   Cardiovascular: Negative for chest pain and palpitations.  Gastrointestinal: Negative for abdominal pain, nausea and vomiting.  Genitourinary: Positive for frequency.    Social History  Substance Use Topics  . Smoking status: Former Research scientist (life sciences)  . Smokeless tobacco: Former Systems developer  . Alcohol use No   Objective:   BP 120/62 (BP Location: Left Arm, Patient Position: Sitting, Cuff Size: Normal)   Pulse (!) 56   Temp 98.7 F (37.1 C) (Oral)   Resp 16   Wt 164 lb (74.4 kg)   BMI 21.64 kg/m   Physical Exam  General Appearance:    Alert, cooperative, no distress  Eyes:    PERRL, conjunctiva/corneas clear,  EOM's intact       Lungs:     Clear to auscultation bilaterally, respirations unlabored  Heart:    Regular rate and rhythm  Abdomen:   bowel sounds present and normal in all 4 quadrants, soft, round, nontender or nondistended. No CVA tenderness     Results for orders placed or performed in visit on 10/31/15  POCT glucose (manual entry)  Result Value Ref Range   POC Glucose 99 70 - 99 mg/dl  POCT urinalysis dipstick  Result Value Ref Range   Color, UA yellow    Clarity, UA clear    Glucose, UA Negative    Bilirubin, UA Negative    Ketones, UA Negative    Spec Grav, UA 1.010    Blood, UA Negative    pH, UA 7.0    Protein, UA Negative    Urobilinogen, UA 0.2    Nitrite, UA Negative    Leukocytes, UA Negative Negative        Assessment & Plan:     1. Frequent urination Cover for cystitis and prostatitis while awaiting culture results.   - ciprofloxacin (CIPRO) 500 MG tablet; Take 1 tablet (500 mg total) by mouth 2 (two) times daily.  Dispense: 20 tablet; Refill: 0 - POCT glucose (manual entry) - POCT urinalysis dipstick - Urine culture       Lelon Huh, MD  [facility]

## 2015-10-31 NOTE — Progress Notes (Signed)
       Patient: Stephen Lewis. Male    DOB: [birth year]   82 y.o.   MRN: [id number] Visit Date: 10/31/2015  Today's Provider: Lelon Huh, MD   Chief Complaint  Patient presents with  . Urinary Frequency   Subjective:    Urinary Frequency   This is a new problem. Associated symptoms include frequency.       No Known Allergies No outpatient prescriptions have been marked as taking for the 10/31/15 encounter (Office Visit) with Birdie Sons, MD.    Review of Systems  Genitourinary: Positive for frequency.    Social History  Substance Use Topics  . Smoking status: Former Research scientist (life sciences)  . Smokeless tobacco: Former Systems developer  . Alcohol use No   Objective:   There were no vitals taken for this visit.  Physical Exam      Assessment & Plan:           Lelon Huh, MD  [facility]

## 2015-11-01 LAB — URINE CULTURE: ORGANISM ID, BACTERIA: NO GROWTH

## 2015-11-11 ENCOUNTER — Telehealth: Payer: Self-pay | Admitting: Family Medicine

## 2015-11-11 NOTE — Telephone Encounter (Signed)
Urine culture is negative. If not improved then he should see urologist.

## 2015-11-11 NOTE — Telephone Encounter (Signed)
Pt's wife calling wanting to know results of the urine culture on the 31st of August.  I looked at the [facility] and she is not listed.  To me it appears his two sons are.  Her call back is [phone]  Thanks teri

## 2015-11-11 NOTE — Telephone Encounter (Addendum)
Patient's wife Darryll Capers was notified of results. Per Darryll Capers pt appears to be improving.

## 2015-11-11 NOTE — Telephone Encounter (Signed)
Please advise results for urine culture form 10/31/2015?

## 2015-12-02 ENCOUNTER — Other Ambulatory Visit: Payer: Self-pay | Admitting: Family Medicine

## 2015-12-02 DIAGNOSIS — G301 Alzheimer's disease with late onset: Principal | ICD-10-CM

## 2015-12-02 DIAGNOSIS — F0280 Dementia in other diseases classified elsewhere without behavioral disturbance: Secondary | ICD-10-CM

## 2016-01-01 DIAGNOSIS — Z23 Encounter for immunization: Secondary | ICD-10-CM | POA: Diagnosis not present

## 2016-01-02 ENCOUNTER — Other Ambulatory Visit: Payer: Self-pay | Admitting: Family Medicine

## 2016-01-02 DIAGNOSIS — G301 Alzheimer's disease with late onset: Principal | ICD-10-CM

## 2016-01-02 DIAGNOSIS — F0280 Dementia in other diseases classified elsewhere without behavioral disturbance: Secondary | ICD-10-CM

## 2016-02-28 ENCOUNTER — Ambulatory Visit: Payer: Medicare Other

## 2016-03-09 ENCOUNTER — Telehealth: Payer: Self-pay | Admitting: Family Medicine

## 2016-03-09 MED ORDER — MEMANTINE HCL 10 MG PO TABS: 10.0000 mg | ORAL_TABLET | Freq: Two times a day (BID) | ORAL | 5 refills | Status: DC

## 2016-03-09 NOTE — Telephone Encounter (Signed)
Pt's wife called saying they wanted the NAMENDA XR 28 MG CP24 24 hr capsule changed to a generic if possible.  The cost was too high.  They use [facility] in [place].  Their call back is [phone]  Thanks Con Memos

## 2016-03-09 NOTE — Telephone Encounter (Signed)
New medication sent in and wife advised. Renaldo Fiddler, CMA

## 2016-03-09 NOTE — Telephone Encounter (Signed)
Can change to memantine 10mg  TWO times daily, #60, rf x 5

## 2016-03-09 NOTE — Telephone Encounter (Signed)
Pt's wife called to get an update on the Rx change request. Please advise. Thanks TNP

## 2016-03-09 NOTE — Telephone Encounter (Signed)
Please review. Thanks!  

## 2016-04-24 DIAGNOSIS — H40003 Preglaucoma, unspecified, bilateral: Secondary | ICD-10-CM | POA: Diagnosis not present

## 2016-04-29 ENCOUNTER — Telehealth: Payer: Self-pay

## 2016-04-29 DIAGNOSIS — R350 Frequency of micturition: Secondary | ICD-10-CM

## 2016-04-29 NOTE — Telephone Encounter (Signed)
Patient's wife Darryll Capers is requesting a urology referral for frequent urination. She states Dr. Caryn Section told her to call if patient has another episode so he could be referred.   CB#[phone]

## 2016-04-29 NOTE — Telephone Encounter (Signed)
Please review-aa 

## 2016-05-18 ENCOUNTER — Ambulatory Visit (INDEPENDENT_AMBULATORY_CARE_PROVIDER_SITE_OTHER): Payer: Medicare Other | Admitting: Urology

## 2016-05-18 ENCOUNTER — Encounter: Payer: Self-pay | Admitting: Urology

## 2016-05-18 VITALS — BP 158/67 | HR 68 | Ht 73.0 in | Wt 166.8 lb

## 2016-05-18 DIAGNOSIS — N3941 Urge incontinence: Secondary | ICD-10-CM

## 2016-05-18 DIAGNOSIS — R350 Frequency of micturition: Secondary | ICD-10-CM

## 2016-05-18 LAB — URINALYSIS, COMPLETE
BILIRUBIN UA: NEGATIVE
Glucose, UA: NEGATIVE
LEUKOCYTES UA: NEGATIVE
Nitrite, UA: NEGATIVE
RBC UA: NEGATIVE
SPEC GRAV UA: 1.02 (ref 1.005–1.030)
UUROB: 0.2 mg/dL (ref 0.2–1.0)
pH, UA: 5.5 (ref 5.0–7.5)

## 2016-05-18 LAB — BLADDER SCAN AMB NON-IMAGING: SCAN RESULT: 39

## 2016-05-18 LAB — MICROSCOPIC EXAMINATION
Bacteria, UA: NONE SEEN
Epithelial Cells (non renal): NONE SEEN /hpf (ref 0–10)
RBC MICROSCOPIC, UA: NONE SEEN /HPF (ref 0–?)
WBC, UA: NONE SEEN /hpf (ref 0–?)

## 2016-05-18 MED ORDER — MIRABEGRON ER 25 MG PO TB24: 25.0000 mg | ORAL_TABLET | Freq: Every day | ORAL | 11 refills | Status: DC

## 2016-05-18 NOTE — Progress Notes (Signed)
05/18/2016 2:43 PM   Stephen Lewis. Mar 28, 1932 [id number]  Referring provider: Birdie Sons, MD [address] [place], [place] [address]  Chief Complaint  Patient presents with  . Urinary Frequency    HPI: I was consulted to assess the patient is urinary frequency and urgency incontinence. He has significant memory issues. His wife was somewhat helpful today in the evaluation. I think he voids every 1-2 hours and he has nocturia. He does not were pads and refuses to wear them but has dampness in his underclothes. He has a diagnosis of dementia. He has been treated for 1 or 2 bladder infections but I did not find positive cultures  He denies a history kidney stones previous GU surgery and he has no other neurologic issues     PMH: Past Medical History:  Diagnosis Date  . History of measles   . Skin cancer     Surgical History: Past Surgical History:  Procedure Laterality Date  . APPENDECTOMY  1951    Home Medications:  Allergies as of 05/18/2016   No Known Allergies     Medication List       Accurate as of 05/18/16  2:43 PM. Always use your most recent med list.          donepezil 10 MG tablet Commonly known as:  ARICEPT TAKE ONE TABLET BY MOUTH AT NIGHT   NAMENDA XR 28 MG Cp24 24 hr capsule Generic drug:  memantine TAKE ONE CAPSULE BY MOUTH ONCE DAILY   memantine 10 MG tablet Commonly known as:  NAMENDA Take 1 tablet (10 mg total) by mouth 2 (two) times daily.       Allergies: No Known Allergies  Family History: Family History  Problem Relation Age of Onset  . Heart Problems Father   . Cirrhosis Sister   . Alzheimer's disease Brother   . Prostate cancer Neg Hx   . Bladder Cancer Neg Hx   . Kidney cancer Neg Hx     Social History:  reports that he has quit smoking. He has quit using smokeless tobacco. He reports that he does not drink alcohol or use drugs.  ROS: UROLOGY Frequent Urination?: Yes Hard to postpone urination?:  Yes Burning/pain with urination?: No Get up at night to urinate?: Yes Leakage of urine?: Yes Urine stream starts and stops?: No Trouble starting stream?: No Do you have to strain to urinate?: No Blood in urine?: No Urinary tract infection?: Yes Sexually transmitted disease?: No Injury to kidneys or bladder?: No Painful intercourse?: No Weak stream?: Yes Erection problems?: No Penile pain?: No  Gastrointestinal Nausea?: No Vomiting?: No Indigestion/heartburn?: No Diarrhea?: Yes Constipation?: Yes  Constitutional Fever: No Night sweats?: No Weight loss?: No Fatigue?: No  Skin Skin rash/lesions?: No Itching?: No  Eyes Blurred vision?: No Double vision?: No  Ears/Nose/Throat Sore throat?: No Sinus problems?: Yes  Hematologic/Lymphatic Swollen glands?: No Easy bruising?: No  Cardiovascular Leg swelling?: No Chest pain?: No  Respiratory Cough?: No Shortness of breath?: No  Endocrine Excessive thirst?: No  Musculoskeletal Back pain?: Yes Joint pain?: No  Neurological Headaches?: No Dizziness?: No  Psychologic Depression?: No Anxiety?: Yes  Physical Exam: BP (!) 158/67 (BP Location: Left Arm, Patient Position: Sitting, Cuff Size: Normal)   Pulse 68   Ht 6\' 1"  (1.854 m)   Wt 75.7 kg (166 lb 12.8 oz)   BMI 22.01 kg/m   Constitutional:  Alert and oriented, No acute distress. HEENT:  AT, moist mucus membranes.  Trachea midline, no masses. Cardiovascular: No clubbing, cyanosis, or edema. Respiratory: Normal respiratory effort, no increased work of breathing. GI: Abdomen is soft, nontender, nondistended, no abdominal masses GU: Male genitalia normal; 50-60 g benign prostate Skin: No rashes, bruises or suspicious lesions. Lymph: No cervical or inguinal adenopathy. Neurologic: Grossly intact, no focal deficits, moving all 4 extremities. Psychiatric: Normal mood and affect.  Laboratory Data: Lab Results  Component Value Date   WBC 7.8  05/10/2013   HGB 14.1 05/10/2013   HCT 43 05/10/2013   PLT 154 05/10/2013    Lab Results  Component Value Date   CREATININE 1.15 05/12/2013    Lab Results  Component Value Date   PSA 4.0 05/10/2013    No results found for: TESTOSTERONE  No results found for: HGBA1C  Urinalysis    Component Value Date/Time   BILIRUBINUR Negative 10/31/2015 1237   PROTEINUR Negative 10/31/2015 1237   UROBILINOGEN 0.2 10/31/2015 1237   NITRITE Negative 10/31/2015 1237   LEUKOCYTESUR Negative 10/31/2015 1237    Pertinent Imaging: None  Assessment & Plan:  The patient has urinary incontinence and frequency and nighttime frequency. He has dementia. I was teasing him that he has a good sense of humor. I thought it was very reasonable to start the patient empirically on a beta 3 agonists with time voiding. He will return to clinic in the next month or so. I think we need to have reasonable treatment goals. Other OAB medications may be utilized and percutaneous tibial nerve stimulation is an option. Alpha blockers are another option  There are no diagnoses linked to this encounter.  No Follow-up on file.  Reece Packer, MD  [facility] [address], [place], [place] [address] [phone]

## 2016-05-22 ENCOUNTER — Telehealth: Payer: Self-pay

## 2016-05-22 NOTE — Telephone Encounter (Signed)
Wife, Darryll Capers, called and states she wants to start process of getting patient in assisting living. She spoke with someone at social services and was advised this needs to be approved by PCP. She made appointment for 05/26/16 to come in by her self to discuss this. She wants to know if that is the right way to go about this?" please review. Thank [phone]

## 2016-05-22 NOTE — Telephone Encounter (Signed)
error 

## 2016-05-22 NOTE — Telephone Encounter (Signed)
Wife, Darryll Capers, called and states she wants to start process of getting patient in assisting living. She spoke with someone at social services and was advised this needs to be approved by PCP. She made appointment for 05/26/16 to come in by her self to discuss this. She wants to know if that is the right way to go about this?" please review. Thank you-aa

## 2016-05-25 ENCOUNTER — Ambulatory Visit (INDEPENDENT_AMBULATORY_CARE_PROVIDER_SITE_OTHER): Payer: Medicare Other | Admitting: Family Medicine

## 2016-05-25 ENCOUNTER — Encounter: Payer: Self-pay | Admitting: Family Medicine

## 2016-05-25 VITALS — BP 132/72 | HR 58 | Temp 97.7°F | Resp 16 | Wt 167.0 lb

## 2016-05-25 DIAGNOSIS — F0281 Dementia in other diseases classified elsewhere with behavioral disturbance: Secondary | ICD-10-CM | POA: Diagnosis not present

## 2016-05-25 DIAGNOSIS — F02818 Dementia in other diseases classified elsewhere, unspecified severity, with other behavioral disturbance: Secondary | ICD-10-CM

## 2016-05-25 DIAGNOSIS — G301 Alzheimer's disease with late onset: Secondary | ICD-10-CM

## 2016-05-25 NOTE — Telephone Encounter (Signed)
Pt's wife called to advise that she couldn't get her that early tomorrow for the appt to discuss assisted living for pt. Pt and wife are reschedule to come in this afternoon so wife Darryll Capers can discuss getting assistance for pt. Thanks TNP

## 2016-05-25 NOTE — Telephone Encounter (Signed)
FYI
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
## 2016-05-26 ENCOUNTER — Ambulatory Visit: Payer: Self-pay | Admitting: Family Medicine

## 2016-05-27 DIAGNOSIS — G301 Alzheimer's disease with late onset: Secondary | ICD-10-CM | POA: Diagnosis not present

## 2016-05-27 DIAGNOSIS — F0281 Dementia in other diseases classified elsewhere with behavioral disturbance: Secondary | ICD-10-CM | POA: Diagnosis not present

## 2016-05-28 ENCOUNTER — Other Ambulatory Visit: Payer: Self-pay | Admitting: Family Medicine

## 2016-05-29 DIAGNOSIS — G301 Alzheimer's disease with late onset: Secondary | ICD-10-CM | POA: Diagnosis not present

## 2016-05-29 DIAGNOSIS — F0281 Dementia in other diseases classified elsewhere with behavioral disturbance: Secondary | ICD-10-CM | POA: Diagnosis not present

## 2016-06-03 ENCOUNTER — Telehealth: Payer: Self-pay

## 2016-06-03 DIAGNOSIS — F0281 Dementia in other diseases classified elsewhere with behavioral disturbance: Secondary | ICD-10-CM | POA: Diagnosis not present

## 2016-06-03 DIAGNOSIS — G301 Alzheimer's disease with late onset: Secondary | ICD-10-CM | POA: Diagnosis not present
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
## 2016-06-03 NOTE — Telephone Encounter (Signed)
I know they have discussed with Dr. Caryn Section last week and are seeking placement for him. He may could benefit from medication to help with his agitation, however, most of these medicines have side effects as well that could put him at risk for fall.    I would recommend waiting for Dr. Caryn Section to return since he knows the patient and family well. Many factors need to be addressed in that decision on which medication would be best, such as risperdal or seroquel vs using an antidepressant such as citalopram vs a benzo such as lorazepam.

## 2016-06-04 NOTE — Telephone Encounter (Signed)
Patients wife Darryll Capers advised and agrees to wait until Dr. Caryn Section returns on Monday for an answer.

## 2016-06-08 ENCOUNTER — Encounter (INDEPENDENT_AMBULATORY_CARE_PROVIDER_SITE_OTHER): Payer: Medicare Other | Admitting: Family Medicine

## 2016-06-08 DIAGNOSIS — G301 Alzheimer's disease with late onset: Secondary | ICD-10-CM | POA: Diagnosis not present

## 2016-06-08 DIAGNOSIS — E785 Hyperlipidemia, unspecified: Secondary | ICD-10-CM

## 2016-06-08 DIAGNOSIS — M1991 Primary osteoarthritis, unspecified site: Secondary | ICD-10-CM

## 2016-06-08 DIAGNOSIS — F0281 Dementia in other diseases classified elsewhere with behavioral disturbance: Secondary | ICD-10-CM

## 2016-06-08 DIAGNOSIS — F840 Autistic disorder: Secondary | ICD-10-CM | POA: Diagnosis not present

## 2016-06-08 DIAGNOSIS — Z87891 Personal history of nicotine dependence: Secondary | ICD-10-CM | POA: Diagnosis not present

## 2016-06-08 MED ORDER — HALOPERIDOL 0.5 MG PO TABS: 0.5000 mg | ORAL_TABLET | ORAL | 1 refills | Status: AC | PRN

## 2016-06-08 NOTE — Telephone Encounter (Signed)
Have sent prescription for Haldol to [facility].

## 2016-06-08 NOTE — Telephone Encounter (Signed)
Advised  ED 

## 2016-06-11 ENCOUNTER — Telehealth: Payer: Self-pay | Admitting: Family Medicine

## 2016-06-11 NOTE — Telephone Encounter (Signed)
FYI

## 2016-06-11 NOTE — Telephone Encounter (Signed)
Darnelle Catalan with [facility] called saying pt and pt wife declined the visit today.  His wife said the visits are upsetting to her husband.  [facility] just called to make Dr. Caryn Section aware of the situation.  Thanks Con Memos

## 2016-06-12 DIAGNOSIS — F0281 Dementia in other diseases classified elsewhere with behavioral disturbance: Secondary | ICD-10-CM | POA: Diagnosis not present

## 2016-06-12 DIAGNOSIS — G301 Alzheimer's disease with late onset: Secondary | ICD-10-CM | POA: Diagnosis not present

## 2016-06-15 ENCOUNTER — Ambulatory Visit (INDEPENDENT_AMBULATORY_CARE_PROVIDER_SITE_OTHER): Payer: Medicare Other | Admitting: Urology

## 2016-06-15 ENCOUNTER — Encounter: Payer: Self-pay | Admitting: Urology

## 2016-06-15 VITALS — BP 144/68 | HR 58 | Ht 73.0 in | Wt 166.0 lb

## 2016-06-15 DIAGNOSIS — R32 Unspecified urinary incontinence: Secondary | ICD-10-CM | POA: Diagnosis not present

## 2016-06-15 DIAGNOSIS — N3281 Overactive bladder: Secondary | ICD-10-CM | POA: Diagnosis not present

## 2016-06-15 MED ORDER — SOLIFENACIN SUCCINATE 5 MG PO TABS: 5.0000 mg | ORAL_TABLET | Freq: Every day | ORAL | 11 refills | Status: DC

## 2016-06-15 NOTE — Progress Notes (Signed)
06/15/2016 3:13 PM   Stephen Lewis. 13-Jan-1933 [id number]  Referring provider: Birdie Sons, MD [address] [place], [place] [address]  Chief Complaint  Patient presents with  . Urinary Frequency    1 month  . Urinary Incontinence    HPI: I was consulted to assess the patient is urinary frequency and urgency incontinence. He has significant memory issues. His wife was somewhat helpful today in the evaluation. I think he voids every 1-2 hours and he has nocturia. He does not were pads and refuses to wear them but has dampness in his underclothes. He has a diagnosis of dementia. He has been treated for 1 or 2 bladder infections but I did not find positive cultures   The patient has urinary incontinence and frequency and nighttime frequency. He has dementia. I was teasing him that he has a good sense of humor. I thought it was very reasonable to start the patient empirically on a beta 3 agonists with time voiding. He will return to clinic in the next month or so. I think we need to have reasonable treatment goals. Other OAB medications may be utilized and percutaneous tibial nerve stimulation is an option. Alpha blockers are another option   PMH: Past Medical History:  Diagnosis Date  . History of measles   . Skin cancer     Surgical History: Past Surgical History:  Procedure Laterality Date  . APPENDECTOMY  1951    Home Medications:  Allergies as of 06/15/2016   No Known Allergies     Medication List       Accurate as of 06/15/16  3:13 PM. Always use your most recent med list.          donepezil 10 MG tablet Commonly known as:  ARICEPT TAKE ONE TABLET BY MOUTH AT NIGHT   haloperidol 0.5 MG tablet Commonly known as:  HALDOL Take 1 tablet (0.5 mg total) by mouth every 2 (two) hours as needed for agitation.   memantine 10 MG tablet Commonly known as:  NAMENDA Take 1 tablet (10 mg total) by mouth 2 (two) times daily.   mirabegron ER 25 MG Tb24  tablet Commonly known as:  MYRBETRIQ Take 1 tablet (25 mg total) by mouth daily.       Allergies: No Known Allergies  Family History: Family History  Problem Relation Age of Onset  . Heart Problems Father   . Cirrhosis Sister   . Alzheimer's disease Brother   . Prostate cancer Neg Hx   . Bladder Cancer Neg Hx   . Kidney cancer Neg Hx     Social History:  reports that he has quit smoking. He has quit using smokeless tobacco. He reports that he does not drink alcohol or use drugs.  ROS: UROLOGY Frequent Urination?: Yes Hard to postpone urination?: No Burning/pain with urination?: No Get up at night to urinate?: Yes Leakage of urine?: No Urine stream starts and stops?: No Trouble starting stream?: No Do you have to strain to urinate?: No Blood in urine?: No Urinary tract infection?: No Sexually transmitted disease?: No Injury to kidneys or bladder?: No Painful intercourse?: No Weak stream?: No Erection problems?: No Penile pain?: No  Gastrointestinal Nausea?: No Vomiting?: No Indigestion/heartburn?: No Diarrhea?: No Constipation?: No  Constitutional Fever: No Night sweats?: No Weight loss?: No Fatigue?: No  Skin Skin rash/lesions?: No Itching?: No  Eyes Blurred vision?: No Double vision?: No  Ears/Nose/Throat Sore throat?: No Sinus problems?: No  Hematologic/Lymphatic Swollen  glands?: No Easy bruising?: No  Cardiovascular Leg swelling?: No Chest pain?: No  Respiratory Cough?: No Shortness of breath?: No  Endocrine Excessive thirst?: No  Musculoskeletal Back pain?: No Joint pain?: No  Neurological Headaches?: No Dizziness?: No  Psychologic Depression?: No Anxiety?: No  Physical Exam: BP (!) 144/68   Pulse (!) 58   Ht 6\' 1"  (1.854 m)   Wt 166 lb (75.3 kg)   BMI 21.90 kg/m   Constitutional:  Alert and oriented, No acute distress.   Laboratory Data: Lab Results  Component Value Date   WBC 7.8 05/10/2013   HGB 14.1  05/10/2013   HCT 43 05/10/2013   PLT 154 05/10/2013    Lab Results  Component Value Date   CREATININE 1.15 05/12/2013    Lab Results  Component Value Date   PSA 4.0 05/10/2013    No results found for: TESTOSTERONE  No results found for: HGBA1C  Urinalysis    Component Value Date/Time   APPEARANCEUR Clear 05/18/2016 1420   GLUCOSEU Negative 05/18/2016 1420   BILIRUBINUR Negative 05/18/2016 1420   PROTEINUR Trace (A) 05/18/2016 1420   UROBILINOGEN 0.2 10/31/2015 1237   NITRITE Negative 05/18/2016 1420   LEUKOCYTESUR Negative 05/18/2016 1420    Pertinent Imaging: none  Assessment & Plan:  The patient was continent with less frequency on the beta 3 agonists. Unfortunately insurance does not cover it and it will be over $300. The pros and cons of antimuscarinics was mentioned. I will see him back in 1 month on  Vesicare 5mg . If this works we can try generic such as tolterodine. Samples may be an issue due to cost issues  There are no diagnoses linked to this encounter.  No Follow-up on file.  Reece Packer, MD  [facility] [address], [place], [place] [address] [phone]

## 2016-06-17 ENCOUNTER — Telehealth: Payer: Self-pay | Admitting: Family Medicine

## 2016-06-17 DIAGNOSIS — F0281 Dementia in other diseases classified elsewhere with behavioral disturbance: Secondary | ICD-10-CM | POA: Diagnosis not present

## 2016-06-17 DIAGNOSIS — G301 Alzheimer's disease with late onset: Secondary | ICD-10-CM | POA: Diagnosis not present

## 2016-06-17 NOTE — Telephone Encounter (Signed)
Stephen Lewis with [facility] called to say they are discharging him homehealth care today.  The wife is deciding if she wants to put him in assisted living or not.  Thanks [employer]

## 2016-06-17 NOTE — Telephone Encounter (Signed)
Please review. Thanks!  

## 2016-06-29 ENCOUNTER — Telehealth: Payer: Self-pay | Admitting: Family Medicine

## 2016-06-29 DIAGNOSIS — G301 Alzheimer's disease with late onset: Principal | ICD-10-CM

## 2016-06-29 DIAGNOSIS — F0280 Dementia in other diseases classified elsewhere without behavioral disturbance: Secondary | ICD-10-CM

## 2016-06-29 MED ORDER — DONEPEZIL HCL 10 MG PO TABS: 10.0000 mg | ORAL_TABLET | ORAL | 4 refills | Status: DC

## 2016-06-29 NOTE — Telephone Encounter (Signed)
Please advise 

## 2016-06-29 NOTE — Telephone Encounter (Signed)
Pt contacted office for refill request on the following medications:  donepezil (ARICEPT) 10 MG tablet.  [facility] [place].  CB#[phone]/MW  Pt wife states he is taking this in the morning now.  She is requesting the Rx to state this is to be taken in the morning/MW

## 2016-07-13 ENCOUNTER — Ambulatory Visit: Payer: Medicare Other | Admitting: Urology

## 2016-08-17 ENCOUNTER — Ambulatory Visit: Payer: Medicare Other

## 2016-08-28 ENCOUNTER — Other Ambulatory Visit: Payer: Self-pay | Admitting: Family Medicine

## 2016-10-13 ENCOUNTER — Encounter: Payer: Self-pay | Admitting: Family Medicine

## 2016-10-13 ENCOUNTER — Ambulatory Visit (INDEPENDENT_AMBULATORY_CARE_PROVIDER_SITE_OTHER): Payer: Medicare Other | Admitting: Family Medicine

## 2016-10-13 VITALS — BP 140/70 | HR 53 | Temp 97.8°F | Resp 16 | Ht 73.0 in | Wt 166.0 lb

## 2016-10-13 DIAGNOSIS — F0281 Dementia in other diseases classified elsewhere with behavioral disturbance: Secondary | ICD-10-CM | POA: Diagnosis not present

## 2016-10-13 DIAGNOSIS — G301 Alzheimer's disease with late onset: Secondary | ICD-10-CM | POA: Diagnosis not present

## 2016-10-13 DIAGNOSIS — F02818 Dementia in other diseases classified elsewhere, unspecified severity, with other behavioral disturbance: Secondary | ICD-10-CM

## 2016-10-13 NOTE — Progress Notes (Signed)
       Patient: Stephen Lewis. Male    DOB: 24-Aug-1932   83 y.o.   MRN: [id number] Visit Date: 10/13/2016  Today's Provider: Lelon Huh, MD   No chief complaint on file.  Subjective:    Patient is here with wife to discuss placing him in a health care facility due to his progressing Alzheimer's.  He is no longer to safely perform any ADLs. He has progressed the ability of home health to accomplish ADLs. He [place] unable to dress and bathe himself safely.   Late onset Alzheimer's disease with behavioral disturbance From 05/25/2016-Patient referred to Home Health.  No Known Allergies   Current Outpatient Prescriptions:  .  donepezil (ARICEPT) 10 MG tablet, Take 1 tablet (10 mg total) by mouth every morning., Disp: 30 tablet, Rfl: 4 .  haloperidol (HALDOL) 0.5 MG tablet, Take 1 tablet (0.5 mg total) by mouth every 2 (two) hours as needed for agitation., Disp: 30 tablet, Rfl: 1 .  mirabegron ER (MYRBETRIQ) 25 MG TB24 tablet, Take 1 tablet (25 mg total) by mouth daily., Disp: 30 tablet, Rfl: 11 .  Pumpkin Seed-Soy Germ (AZO BLADDER CONTROL/GO-LESS PO), Take by mouth., Disp: , Rfl:   Review of Systems  Constitutional: Negative for appetite change, chills and fever.  Respiratory: Negative for chest tightness, shortness of breath and wheezing.   Cardiovascular: Negative for chest pain and palpitations.  Gastrointestinal: Negative for abdominal pain, nausea and vomiting.    Social History  Substance Use Topics  . Smoking status: Former Research scientist (life sciences)  . Smokeless tobacco: Former Systems developer  . Alcohol use No   Objective:   BP 140/70 (BP Location: Right Arm, Patient Position: Sitting, Cuff Size: Normal)   Pulse (!) 53   Temp 97.8 F (36.6 C) (Oral)   Resp 16   Ht 6\' 1"  (1.854 m)   Wt 166 lb (75.3 kg)   SpO2 97%   BMI 21.90 kg/m  Vitals:   10/13/16 1026  BP: 140/70  Pulse: (!) 53  Resp: 16  Temp: 97.8 F (36.6 C)  TempSrc: Oral  SpO2: 97%  Weight: 166 lb (75.3 kg)  Height: 6\' 1"   (1.854 m)   Fall Risk  10/13/2016 01/17/2015  Falls in the past year? No No   Depression screen [facility] 2/9 10/13/2016 01/17/2015  Decreased Interest 0 1  Down, Depressed, Hopeless 0 0  PHQ - 2 Score 0 1      Physical Exam   General Appearance:    Alert, cooperative, no distress  Eyes:    PERRL, conjunctiva/corneas clear, EOM's intact       Lungs:     Clear to auscultation bilaterally, respirations unlabored  Heart:    Regular rate and rhythm  Neurologic:   Awake, alert, oriented x 1, although not always able to appropriately process and answer questions. .            Assessment & Plan:     1. Late onset Alzheimer's disease with behavioral disturbance Continues to deteriorate and needs higher level of care. Patient's wife is currently working and getting him placed and will have FL-2 form and admission orders sent when ready.    Over half of this 25 minute visit were spent in counseling and coordinating care of multiple medical problems.       Lelon Huh, MD  [facility]

## 2016-10-21 DIAGNOSIS — H40003 Preglaucoma, unspecified, bilateral: Secondary | ICD-10-CM | POA: Diagnosis not present

## 2016-10-30 DIAGNOSIS — H401131 Primary open-angle glaucoma, bilateral, mild stage: Secondary | ICD-10-CM | POA: Diagnosis not present

## 2016-11-19 ENCOUNTER — Ambulatory Visit (INDEPENDENT_AMBULATORY_CARE_PROVIDER_SITE_OTHER): Payer: Medicare Other | Admitting: Family Medicine

## 2016-11-19 ENCOUNTER — Encounter: Payer: Self-pay | Admitting: Family Medicine

## 2016-11-19 VITALS — BP 162/70 | HR 56 | Temp 97.4°F | Resp 16 | Wt 165.0 lb

## 2016-11-19 DIAGNOSIS — R350 Frequency of micturition: Secondary | ICD-10-CM | POA: Diagnosis not present

## 2016-11-19 LAB — POCT URINALYSIS DIPSTICK
BILIRUBIN UA: NEGATIVE
Blood, UA: NEGATIVE
Glucose, UA: NEGATIVE
KETONES UA: NEGATIVE
LEUKOCYTES UA: NEGATIVE
Nitrite, UA: NEGATIVE
PROTEIN UA: NEGATIVE
SPEC GRAV UA: 1.01 (ref 1.010–1.025)
Urobilinogen, UA: 0.2 E.U./dL
pH, UA: 6.5 (ref 5.0–8.0)

## 2016-11-19 NOTE — Patient Instructions (Signed)

## 2016-11-19 NOTE — Progress Notes (Signed)
Patient: Stephen Lewis. Male    DOB: 01-08-1933   83 y.o.   MRN: [id number] Visit Date: 11/19/2016  Today's Provider: Lavon Paganini, MD   Chief Complaint  Patient presents with  . Urinary Frequency   Subjective:    Urinary Frequency   This is a recurrent (pt has a H/O this, but is taking AZO Bladder control for sx, as well as Myrbetriq. These medications improve sx, but pt's wife is concenred because the sx are worsening. She is concerned of infection.) problem. The patient is experiencing no pain. There has been no fever. Associated symptoms include chills ("cold natured"), frequency, hesitancy and urgency. Pertinent negatives include no flank pain, hematuria, nausea, sweats or vomiting.   Did stop Myrbetriq several months ago as it was cost prohibitive.  He is no longer seeing Urology.  He has fairly progressed dementia.  When initially asked to provide a urine sample, he provided a cup filled with only water.    No Known Allergies   Current Outpatient Prescriptions:  .  donepezil (ARICEPT) 10 MG tablet, Take 1 tablet (10 mg total) by mouth every morning., Disp: 30 tablet, Rfl: 4 .  haloperidol (HALDOL) 0.5 MG tablet, Take 1 tablet (0.5 mg total) by mouth every 2 (two) hours as needed for agitation., Disp: 30 tablet, Rfl: 1 .  latanoprost (XALATAN) 0.005 % ophthalmic solution, , Disp: , Rfl:  .  memantine (NAMENDA) 10 MG tablet, Take 1 tablet by mouth 2 (two) times daily., Disp: , Rfl:  .  mirabegron ER (MYRBETRIQ) 25 MG TB24 tablet, Take 1 tablet (25 mg total) by mouth daily., Disp: 30 tablet, Rfl: 11 .  Pumpkin Seed-Soy Germ (AZO BLADDER CONTROL/GO-LESS PO), Take by mouth., Disp: , Rfl:   Review of Systems  Constitutional: Positive for chills ("cold natured").  Gastrointestinal: Negative for nausea and vomiting.  Genitourinary: Positive for frequency, hesitancy and urgency. Negative for flank pain and hematuria.    Social History  Substance Use Topics  . Smoking  status: Former Research scientist (life sciences)  . Smokeless tobacco: Former Systems developer  . Alcohol use No   Objective:   BP (!) 162/70 (BP Location: Left Arm, Patient Position: Sitting, Cuff Size: Normal)   Pulse (!) 56   Temp (!) 97.4 F (36.3 C) (Oral)   Resp 16   Wt 165 lb (74.8 kg)   BMI 21.77 kg/m  Vitals:   11/19/16 1606  BP: (!) 162/70  Pulse: (!) 56  Resp: 16  Temp: (!) 97.4 F (36.3 C)  TempSrc: Oral  Weight: 165 lb (74.8 kg)     Physical Exam  Constitutional: He appears well-developed and well-nourished. No distress.  HENT:  Head: Normocephalic and atraumatic.  Cardiovascular: Normal rate and regular rhythm.   Pulmonary/Chest: Effort normal. No respiratory distress.  Abdominal: Soft. Bowel sounds are normal. He exhibits no distension. There is no tenderness. There is no rebound and no guarding.  Musculoskeletal: He exhibits no edema.  Neurological: He is alert.  Skin: Skin is warm and dry. No rash noted.  Vitals reviewed.   Urinalysis    Component Value Date/Time   APPEARANCEUR Clear 05/18/2016 1420   GLUCOSEU Negative 05/18/2016 1420   BILIRUBINUR Negative 11/19/2016 1642   BILIRUBINUR Negative 05/18/2016 1420   PROTEINUR Negative 11/19/2016 1642   PROTEINUR Trace (A) 05/18/2016 1420   UROBILINOGEN 0.2 11/19/2016 1642   NITRITE Negative 11/19/2016 1642   NITRITE Negative 05/18/2016 1420   LEUKOCYTESUR Negative 11/19/2016 1642   LEUKOCYTESUR Negative 05/18/2016  1420        Assessment & Plan:     1. Urinary frequency - likely related ot OAB for which he was previously treated - Urology has tried multiple medications and was considering PTNS - POCT urinalysis dipstick - negative, no signs of infection - reassurance given to wife - could consider timed voids and wearing pads - he does have dementia with behavioral disturbance which may be a limiting factor - return precautions discussed     The entirety of the information documented in the History of Present Illness, Review  of Systems and Physical Exam were personally obtained by me. Portions of this information were initially documented by Raquel Sarna Ratchford, CMA and reviewed by me for thoroughness and accuracy.    Lavon Paganini, MD  [facility]

## 2016-11-22 IMAGING — CR DG CHEST 2V
1 series · 2 of 2 positions shown · non-contrast
Comparison: None.

CLINICAL DATA: Abnormal TB skin test .

EXAM:
CHEST  2 VIEW

[Series 1: dg chest 2 view · 0.14mm/px · 2 of 2 slices shown]
[im 1/2]
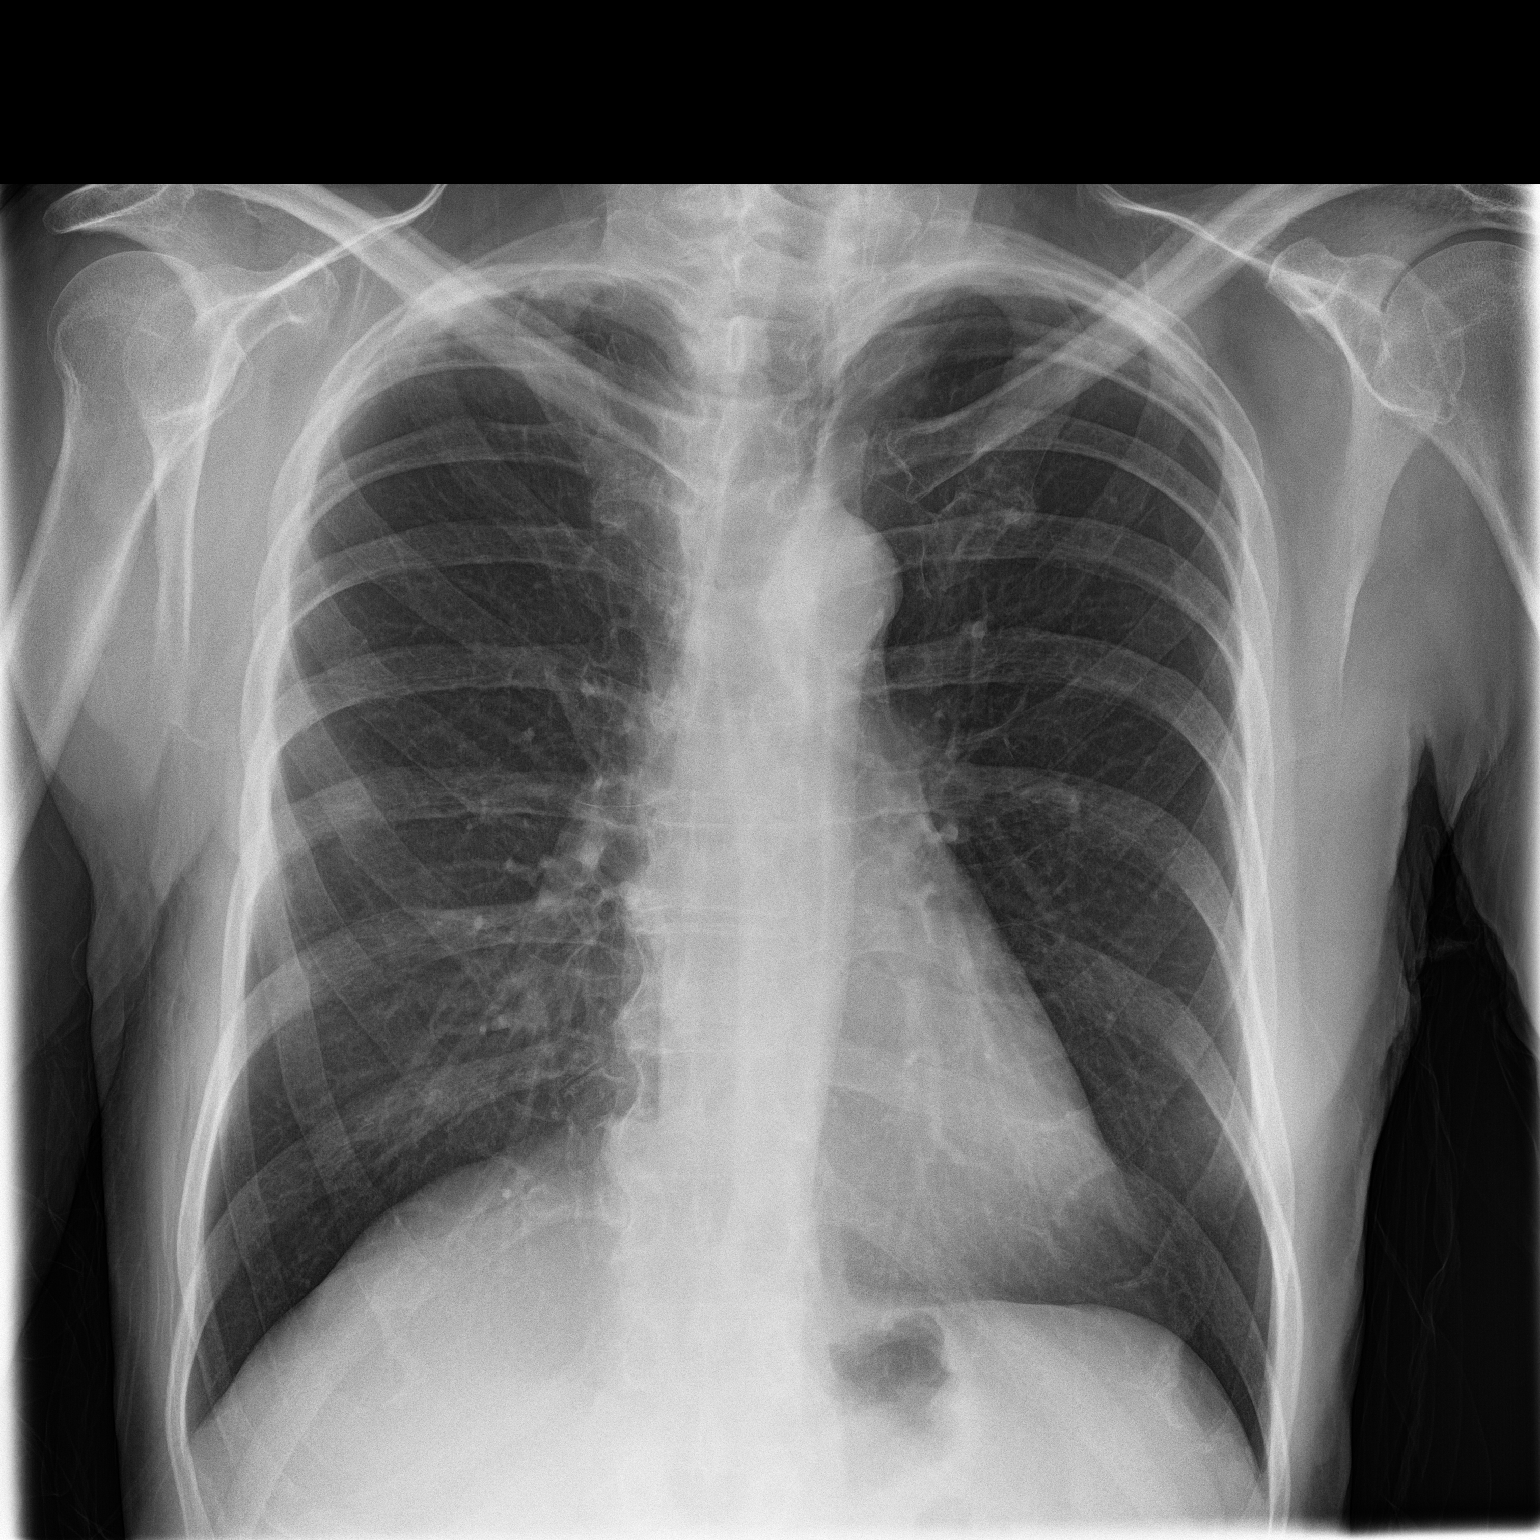
[im 2/2]
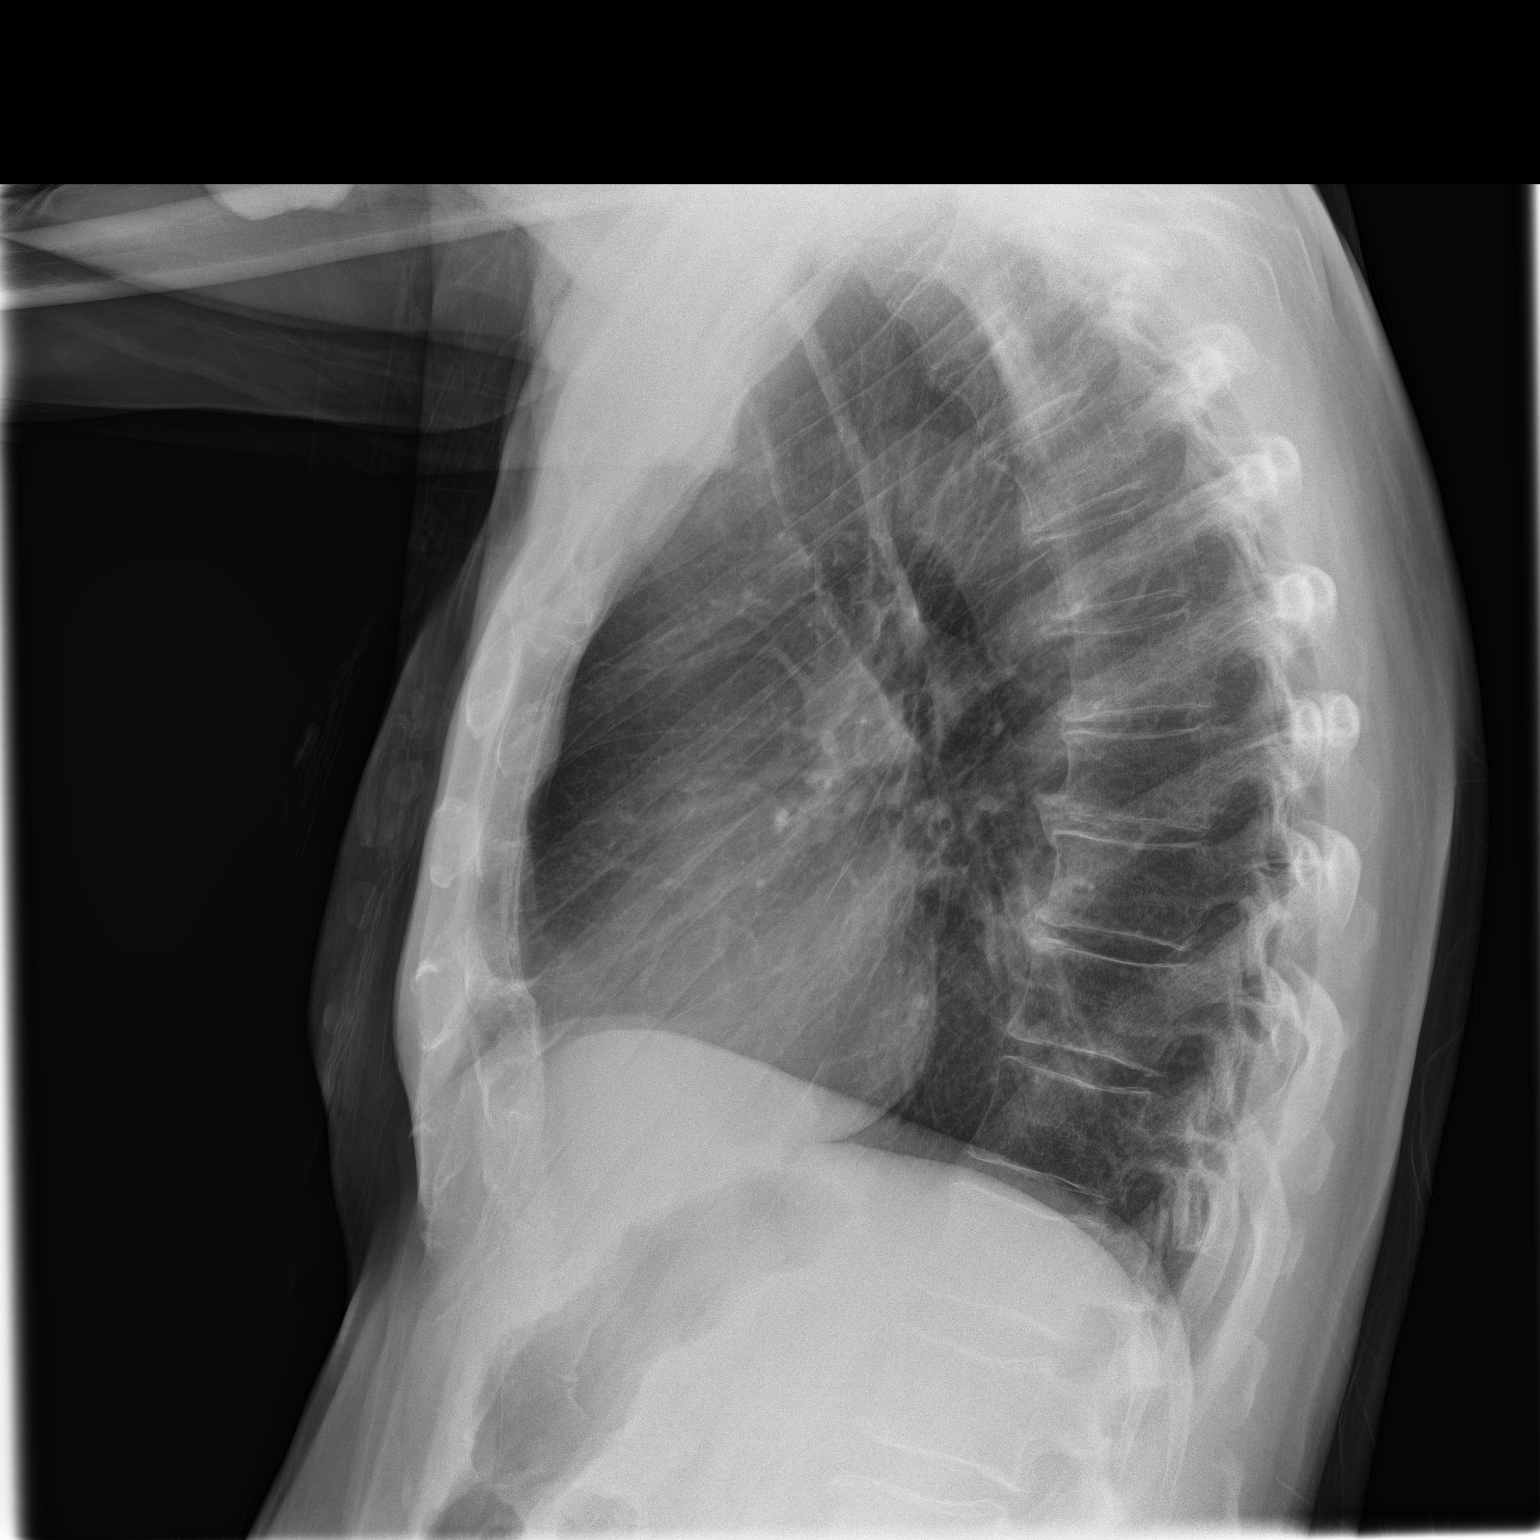

[2 of 2 positions shown; findings below may reference images not displayed]

FINDINGS: Mediastinum and hilar structures normal.Heart size normal. Lungs are
clear. Mild apical pleural thickening noted consistent with
scarring. No pleural effusion or pneumothorax. Degenerative changes
thoracic spine. Old healed right rib fractures.
IMPRESSION: No active cardiopulmonary disease.

## 2016-11-25 ENCOUNTER — Other Ambulatory Visit: Payer: Self-pay | Admitting: Family Medicine

## 2016-11-25 DIAGNOSIS — F0280 Dementia in other diseases classified elsewhere without behavioral disturbance: Secondary | ICD-10-CM

## 2016-11-25 DIAGNOSIS — G301 Alzheimer's disease with late onset: Principal | ICD-10-CM

## 2016-11-28 ENCOUNTER — Ambulatory Visit (INDEPENDENT_AMBULATORY_CARE_PROVIDER_SITE_OTHER): Payer: Medicare Other

## 2016-11-28 DIAGNOSIS — Z23 Encounter for immunization: Secondary | ICD-10-CM | POA: Diagnosis not present

## 2016-11-30 DIAGNOSIS — H401131 Primary open-angle glaucoma, bilateral, mild stage: Secondary | ICD-10-CM | POA: Diagnosis not present

## 2016-12-01 ENCOUNTER — Telehealth: Payer: Self-pay

## 2016-12-01 NOTE — Telephone Encounter (Signed)
Please advise 

## 2016-12-01 NOTE — Telephone Encounter (Signed)
Wife wanted to know of FL2 form what is "ICF" checked off underneath recommended level of care? And "SNF".

## 2016-12-01 NOTE — Telephone Encounter (Signed)
LMOVM to return call.

## 2016-12-01 NOTE — Telephone Encounter (Signed)
ICF is intermediate care facility, which is what most alzheimer's unts are. SNF is a skilled nursing facility.

## 2016-12-02 NOTE — Telephone Encounter (Signed)
Pt's wife advised.

## 2017-01-13 ENCOUNTER — Ambulatory Visit (INDEPENDENT_AMBULATORY_CARE_PROVIDER_SITE_OTHER): Payer: Medicare Other

## 2017-01-13 ENCOUNTER — Ambulatory Visit (INDEPENDENT_AMBULATORY_CARE_PROVIDER_SITE_OTHER): Payer: Medicare Other | Admitting: Family Medicine

## 2017-01-13 VITALS — BP 142/58 | HR 64 | Temp 98.0°F | Ht 73.0 in | Wt 169.6 lb

## 2017-01-13 DIAGNOSIS — Z23 Encounter for immunization: Secondary | ICD-10-CM

## 2017-01-13 DIAGNOSIS — Z0000 Encounter for general adult medical examination without abnormal findings: Secondary | ICD-10-CM | POA: Diagnosis not present

## 2017-01-13 DIAGNOSIS — F0281 Dementia in other diseases classified elsewhere with behavioral disturbance: Secondary | ICD-10-CM

## 2017-01-13 DIAGNOSIS — F02818 Dementia in other diseases classified elsewhere, unspecified severity, with other behavioral disturbance: Secondary | ICD-10-CM

## 2017-01-13 DIAGNOSIS — G301 Alzheimer's disease with late onset: Secondary | ICD-10-CM

## 2017-01-13 NOTE — Progress Notes (Signed)
       Patient: Stephen Lewis. Male    DOB: 1933/01/24   83 y.o.   MRN: [id number] Visit Date: 01/13/2017  Today's Provider: Lelon Huh, MD   No chief complaint on file.  Subjective:   Patient saw [facility] today at 2:00 pm for AVW.  HPI  Late onset Alzheimer's disease with behavioral disturbance From 10/13/2016-no changes. Continues to deteriorate and needs higher level of care. Patient's wife is currently working and getting him placed and will have FL-2 form and admission orders sent when ready. He also continues to go to [facility] and needs health forms completed.   No Known Allergies   Current Outpatient Medications:  .  donepezil (ARICEPT) 10 MG tablet, TAKE 1 TABLET BY MOUTH IN THE MORNING, Disp: 30 tablet, Rfl: 12 .  haloperidol (HALDOL) 0.5 MG tablet, Take 1 tablet (0.5 mg total) by mouth every 2 (two) hours as needed for agitation., Disp: 30 tablet, Rfl: 1 .  latanoprost (XALATAN) 0.005 % ophthalmic solution, , Disp: , Rfl:  .  memantine (NAMENDA) 10 MG tablet, Take 1 tablet by mouth 2 (two) times daily., Disp: , Rfl:  .  mirabegron ER (MYRBETRIQ) 25 MG TB24 tablet, Take 1 tablet (25 mg total) by mouth daily., Disp: 30 tablet, Rfl: 11 .  Pumpkin Seed-Soy Germ (AZO BLADDER CONTROL/GO-LESS PO), Take by mouth., Disp: , Rfl:   Review of Systems  Constitutional: Negative for appetite change, chills and fever.  Respiratory: Negative for chest tightness, shortness of breath and wheezing.   Cardiovascular: Negative for chest pain and palpitations.  Gastrointestinal: Negative for abdominal pain, nausea and vomiting.  Endocrine: Positive for polyuria.  All other systems reviewed and are negative.   Social History   Tobacco Use  . Smoking status: Former Research scientist (life sciences)  . Smokeless tobacco: Former Network engineer Use Topics  . Alcohol use: No    Alcohol/week: 0.0 oz   Objective:    BP 142/58 Abnormal  (BP Location: Left Arm)   Pulse 64   Temp 98 F (36.7 C)  (Oral)   Ht 6\' 1"  (1.854 m)   Wt 169 lb 9.6 oz (76.9 kg)   BMI 22.38 kg/m   BSA 1.99 m      Physical Exam   General Appearance:    Alert, cooperative, no distress  Eyes:    PERRL, conjunctiva/corneas clear, EOM's intact       Lungs:     Clear to auscultation bilaterally, respirations unlabored  Heart:    Regular rate and rhythm  Neurologic:   Awake, alert, oriented x 1. No apparent focal neurological           defect.           Assessment & Plan:     1. Late onset Alzheimer's disease with behavioral disturbance Stable on current medications. Completed forms for [facility]. Expecting placement for higher level of care in the near future.        Lelon Huh, MD  [facility]

## 2017-01-13 NOTE — Patient Instructions (Signed)
Stephen Lewis , Thank you for taking time to come for your Medicare Wellness Visit. I appreciate your ongoing commitment to your health goals. Please review the following plan we discussed and let me know if I can assist you in the future.   Screening recommendations/referrals: Colonoscopy: no longer required Recommended yearly ophthalmology/optometry visit for glaucoma screening and checkup Recommended yearly dental visit for hygiene and checkup  Vaccinations: Influenza vaccine: completed Pneumococcal vaccine: completed today Tdap vaccine: declined Shingles vaccine: declined  Advanced directives: Advance directive discussed with you today. I have provided a copy for you to complete at home and have notarized. Once this is complete please bring a copy in to our office so we can scan it into your chart.  Conditions/risks identified: Fall risk prevention; Recommend increasing water intake to 4 glasses of water a day.   Next appointment: 3:00 PM today  Preventive Care 65 Years and Older, Male Preventive care refers to lifestyle choices and visits with your health care provider that can promote health and wellness. What does preventive care include?  A yearly physical exam. This is also called an annual well check.  Dental exams once or twice a year.  Routine eye exams. Ask your health care provider how often you should have your eyes checked.  Personal lifestyle choices, including:  Daily care of your teeth and gums.  Regular physical activity.  Eating a healthy diet.  Avoiding tobacco and drug use.  Limiting alcohol use.  Practicing safe sex.  Taking low doses of aspirin every day.  Taking vitamin and mineral supplements as recommended by your health care provider. What happens during an annual well check? The services and screenings done by your health care provider during your annual well check will depend on your age, overall health, lifestyle risk factors, and family  history of disease. Counseling  Your health care provider may ask you questions about your:  Alcohol use.  Tobacco use.  Drug use.  Emotional well-being.  Home and relationship well-being.  Sexual activity.  Eating habits.  History of falls.  Memory and ability to understand (cognition).  Work and work Statistician. Screening  You may have the following tests or measurements:  Height, weight, and BMI.  Blood pressure.  Lipid and cholesterol levels. These may be checked every 5 years, or more frequently if you are over 54 years old.  Skin check.  Lung cancer screening. You may have this screening every year starting at age 70 if you have a 30-pack-year history of smoking and currently smoke or have quit within the past 15 years.  Fecal occult blood test (FOBT) of the stool. You may have this test every year starting at age 54.  Flexible sigmoidoscopy or colonoscopy. You may have a sigmoidoscopy every 5 years or a colonoscopy every 10 years starting at age 53.  Prostate cancer screening. Recommendations will vary depending on your family history and other risks.  Hepatitis C blood test.  Hepatitis B blood test.  Sexually transmitted disease (STD) testing.  Diabetes screening. This is done by checking your blood sugar (glucose) after you have not eaten for a while (fasting). You may have this done every 1-3 years.  Abdominal aortic aneurysm (AAA) screening. You may need this if you are a current or former smoker.  Osteoporosis. You may be screened starting at age 62 if you are at high risk. Talk with your health care provider about your test results, treatment options, and if necessary, the need for more tests.  Vaccines  Your health care provider may recommend certain vaccines, such as:  Influenza vaccine. This is recommended every year.  Tetanus, diphtheria, and acellular pertussis (Tdap, Td) vaccine. You may need a Td booster every 10 years.  Zoster vaccine.  You may need this after age 22.  Pneumococcal 13-valent conjugate (PCV13) vaccine. One dose is recommended after age 3.  Pneumococcal polysaccharide (PPSV23) vaccine. One dose is recommended after age 80. Talk to your health care provider about which screenings and vaccines you need and how often you need them. This information is not intended to replace advice given to you by your health care provider. Make sure you discuss any questions you have with your health care provider. Document Released: 03/15/2015 Document Revised: 11/06/2015 Document Reviewed: 12/18/2014 Elsevier Interactive Patient Education  2017 [place] Prevention in the Home Falls can cause injuries. They can happen to people of all ages. There are many things you can do to make your home safe and to help prevent falls. What can I do on the outside of my home?  Regularly fix the edges of walkways and driveways and fix any cracks.  Remove anything that might make you trip as you walk through a door, such as a raised step or threshold.  Trim any bushes or trees on the path to your home.  Use bright outdoor lighting.  Clear any walking paths of anything that might make someone trip, such as rocks or tools.  Regularly check to see if handrails are loose or broken. Make sure that both sides of any steps have handrails.  Any raised decks and porches should have guardrails on the edges.  Have any leaves, snow, or ice cleared regularly.  Use sand or salt on walking paths during winter.  Clean up any spills in your garage right away. This includes oil or grease spills. What can I do in the bathroom?  Use night lights.  Install grab bars by the toilet and in the tub and shower. Do not use towel bars as grab bars.  Use non-skid mats or decals in the tub or shower.  If you need to sit down in the shower, use a plastic, non-slip stool.  Keep the floor dry. Clean up any water that spills on the floor as soon  as it happens.  Remove soap buildup in the tub or shower regularly.  Attach bath mats securely with double-sided non-slip rug tape.  Do not have throw rugs and other things on the floor that can make you trip. What can I do in the bedroom?  Use night lights.  Make sure that you have a light by your bed that is easy to reach.  Do not use any sheets or blankets that are too big for your bed. They should not hang down onto the floor.  Have a firm chair that has side arms. You can use this for support while you get dressed.  Do not have throw rugs and other things on the floor that can make you trip. What can I do in the kitchen?  Clean up any spills right away.  Avoid walking on wet floors.  Keep items that you use a lot in easy-to-reach places.  If you need to reach something above you, use a strong step stool that has a grab bar.  Keep electrical cords out of the way.  Do not use floor polish or wax that makes floors slippery. If you must use wax, use non-skid floor wax.  Do not have throw rugs and other things on the floor that can make you trip. What can I do with my stairs?  Do not leave any items on the stairs.  Make sure that there are handrails on both sides of the stairs and use them. Fix handrails that are broken or loose. Make sure that handrails are as long as the stairways.  Check any carpeting to make sure that it is firmly attached to the stairs. Fix any carpet that is loose or worn.  Avoid having throw rugs at the top or bottom of the stairs. If you do have throw rugs, attach them to the floor with carpet tape.  Make sure that you have a light switch at the top of the stairs and the bottom of the stairs. If you do not have them, ask someone to add them for you. What else can I do to help prevent falls?  Wear shoes that:  Do not have high heels.  Have rubber bottoms.  Are comfortable and fit you well.  Are closed at the toe. Do not wear sandals.  If  you use a stepladder:  Make sure that it is fully opened. Do not climb a closed stepladder.  Make sure that both sides of the stepladder are locked into place.  Ask someone to hold it for you, if possible.  Clearly mark and make sure that you can see:  Any grab bars or handrails.  First and last steps.  Where the edge of each step is.  Use tools that help you move around (mobility aids) if they are needed. These include:  Canes.  Walkers.  Scooters.  Crutches.  Turn on the lights when you go into a dark area. Replace any light bulbs as soon as they burn out.  Set up your furniture so you have a clear path. Avoid moving your furniture around.  If any of your floors are uneven, fix them.  If there are any pets around you, be aware of where they are.  Review your medicines with your doctor. Some medicines can make you feel dizzy. This can increase your chance of falling. Ask your doctor what other things that you can do to help prevent falls. This information is not intended to replace advice given to you by your health care provider. Make sure you discuss any questions you have with your health care provider. Document Released: 12/13/2008 Document Revised: 07/25/2015 Document Reviewed: 03/23/2014 Elsevier Interactive Patient Education  2017 [employer].

## 2017-01-13 NOTE — Progress Notes (Signed)
Subjective:   Stephen Lewis. is a 83 y.o. male who presents for Medicare Annual/Subsequent preventive examination.  Review of Systems:  N/A  Cardiac Risk Factors include: advanced age (>50men, >74 women);dyslipidemia     Objective:    Vitals: BP (!) 142/58 (BP Location: Left Arm)   Pulse 64   Temp 98 F (36.7 C) (Oral)   Ht 6\' 1"  (1.854 m)   Wt 169 lb 9.6 oz (76.9 kg)   BMI 22.38 kg/m   Body mass index is 22.38 kg/m.  Tobacco Social History   Tobacco Use  Smoking Status Former Smoker  . Types: Cigarettes  Smokeless Tobacco Former Engineer, structural given: Not Answered   Past Medical History:  Diagnosis Date  . History of measles   . Skin cancer    Past Surgical History:  Procedure Laterality Date  . APPENDECTOMY  1951   Family History  Problem Relation Age of Onset  . Heart Problems Father   . Cirrhosis Sister   . Alzheimer's disease Brother   . Prostate cancer Neg Hx   . Bladder Cancer Neg Hx   . Kidney cancer Neg Hx    Social History   Substance and Sexual Activity  Sexual Activity Not on file    Outpatient Encounter Medications as of 01/13/2017  Medication Sig  . donepezil (ARICEPT) 10 MG tablet TAKE 1 TABLET BY MOUTH IN THE MORNING  . haloperidol (HALDOL) 0.5 MG tablet Take 1 tablet (0.5 mg total) by mouth every 2 (two) hours as needed for agitation.  Marland Kitchen latanoprost (XALATAN) 0.005 % ophthalmic solution Place 1 drop at bedtime into both eyes.   . memantine (NAMENDA) 10 MG tablet Take 1 tablet by mouth 2 (two) times daily.  . Pumpkin Seed-Soy Germ (AZO BLADDER CONTROL/GO-LESS PO) Take 1 tablet 2 (two) times daily by mouth.   . Pumpkin Seed-Soy Germ (AZO BLADDER CONTROL/GO-LESS PO) Take by mouth.  . mirabegron ER (MYRBETRIQ) 25 MG TB24 tablet Take 1 tablet (25 mg total) by mouth daily. (Patient not taking: Reported on 01/13/2017)   No facility-administered encounter medications on file as of 01/13/2017.     Activities of Daily Living In  your present state of health, do you have any difficulty performing the following activities: 01/13/2017  Hearing? N  Vision? Y  Comment has glaucoma in the left eye  Difficulty concentrating or making decisions? Y  Comment currently on Aricept  Walking or climbing stairs? N  Dressing or bathing? Y  Doing errands, shopping? Y  Preparing Food and eating ? Y  Using the Toilet? N  In the past six months, have you accidently leaked urine? N  Do you have problems with loss of bowel control? N  Managing your Medications? Y  Managing your Finances? Y  Housekeeping or managing your Housekeeping? Y  Some recent data might be hidden    Patient Care Team: Birdie Sons, MD as PCP - General (Family Medicine) Leandrew Koyanagi, MD as Referring Physician (Ophthalmology)   Assessment:     Exercise Activities and Dietary recommendations Current Exercise Habits: The patient does not participate in regular exercise at present, Exercise limited by: neurologic condition(s)(wife states she does not have time to attend to this in addition to everything else)  Goals    None     Fall Risk Fall Risk  01/13/2017 10/13/2016 01/17/2015  Falls in the past year? Yes No No  Number falls in past yr: 1 - -  Injury  with Fall? No - -  Follow up Falls prevention discussed - -   Depression Screen PHQ 2/9 Scores 01/13/2017 10/13/2016 01/17/2015  PHQ - 2 Score 0 0 1    Cognitive Function     6CIT Screen 01/13/2017  What Year? 4 points  What month? 3 points  What time? 3 points  Count back from 20 4 points  Months in reverse 4 points  Repeat phrase 10 points  Total Score 28    Immunization History  Administered Date(s) Administered  . Influenza, High Dose Seasonal PF 11/28/2016  . Pneumococcal Conjugate-13 01/17/2015  . Pneumococcal Polysaccharide-23 01/13/2017   Screening Tests Health Maintenance  Topic Date Due  . TETANUS/TDAP  07/14/1953  . INFLUENZA VACCINE  Completed  . PNA vac  Low Risk Adult  Completed      Plan:  I have personally reviewed and addressed the Medicare Annual Wellness questionnaire and have noted the following in the patient's chart:  A. Medical and social history B. Use of alcohol, tobacco or illicit drugs  C. Current medications and supplements D. Functional ability and status E.  Nutritional status F.  Physical activity G. Advance directives H. List of other physicians I.  Hospitalizations, surgeries, and ER visits in previous 12 months J.  [place] such as hearing and vision if needed, cognitive and depression L. Referrals and appointments - none  In addition, I have reviewed and discussed with patient certain preventive protocols, quality metrics, and best practice recommendations. A written personalized care plan for preventive services as well as general preventive health recommendations were provided to patient.  See attached scanned questionnaire for additional information.   Signed,  Fabio Neighbors, LPN Nurse Health Advisor   MD Recommendations: None. Pt declined the tetanus vaccine today.

## 2017-03-12 ENCOUNTER — Telehealth: Payer: Self-pay | Admitting: Family Medicine

## 2017-03-12 NOTE — Telephone Encounter (Signed)
Patient states that she spoke with you several weeks ago and is requesting that you call her.  CB# [phone] and [phone]

## 2017-03-15 NOTE — Telephone Encounter (Signed)
Thanks!  I spoke to her about her wanting to look for placement then for her husband.  My former job was as a Education officer, museum for [employer] and I still have a lot of information so I will see how I can help!  Thanks again! Amy

## 2017-04-13 ENCOUNTER — Ambulatory Visit (INDEPENDENT_AMBULATORY_CARE_PROVIDER_SITE_OTHER): Payer: Medicare Other | Admitting: Family Medicine

## 2017-04-13 ENCOUNTER — Encounter: Payer: Self-pay | Admitting: Family Medicine

## 2017-04-13 VITALS — BP 132/72 | HR 52 | Temp 97.9°F | Resp 16 | Ht 73.0 in | Wt 171.0 lb

## 2017-04-13 DIAGNOSIS — H409 Unspecified glaucoma: Secondary | ICD-10-CM | POA: Diagnosis not present

## 2017-04-13 DIAGNOSIS — G301 Alzheimer's disease with late onset: Secondary | ICD-10-CM

## 2017-04-13 DIAGNOSIS — F0281 Dementia in other diseases classified elsewhere with behavioral disturbance: Secondary | ICD-10-CM

## 2017-04-13 DIAGNOSIS — R350 Frequency of micturition: Secondary | ICD-10-CM | POA: Diagnosis not present

## 2017-04-13 DIAGNOSIS — F02818 Dementia in other diseases classified elsewhere, unspecified severity, with other behavioral disturbance: Secondary | ICD-10-CM

## 2017-04-13 NOTE — Progress Notes (Signed)
Patient: Stephen Lewis. Male    DOB: Jun 07, 1932   84 y.o.   MRN: [id number] Visit Date: 04/13/2017  Today's Provider: Lelon Huh, MD   Chief Complaint  Patient presents with  . Dementia   Subjective:    HPI  Patient presents for follow up Alzheimer's dementia. His wife reports he is taking medication consistently. Has been going to [facility] three days a week, but is becoming increasing unmanagable. Is now unable to bath and dress himself. He wanders around facility getting lost, disrupting other residents and taking other patient's items. His wife is no longer able to manage him at home and is seeking placement in skilled nursing facility.  Has had trouble controlling bladder in the past and was on Myrbetriq but changed to OTC azo which his wife reports has been very effective.     No Known Allergies   Current Outpatient Medications:  .  donepezil (ARICEPT) 10 MG tablet, TAKE 1 TABLET BY MOUTH IN THE MORNING, Disp: 30 tablet, Rfl: 12 .  haloperidol (HALDOL) 0.5 MG tablet, Take 1 tablet (0.5 mg total) by mouth every 2 (two) hours as needed for agitation., Disp: 30 tablet, Rfl: 1 .  latanoprost (XALATAN) 0.005 % ophthalmic solution, Place 1 drop at bedtime into both eyes. , Disp: , Rfl:  .  memantine (NAMENDA) 10 MG tablet, Take 1 tablet by mouth 2 (two) times daily., Disp: , Rfl:  .  Pumpkin Seed-Soy Germ (AZO BLADDER CONTROL/GO-LESS PO), Take 1 tablet 2 (two) times daily by mouth. , Disp: , Rfl:  .  Pumpkin Seed-Soy Germ (AZO BLADDER CONTROL/GO-LESS PO), Take by mouth., Disp: , Rfl:  .  mirabegron ER (MYRBETRIQ) 25 MG TB24 tablet, Take 1 tablet (25 mg total) by mouth daily. (Patient not taking: Reported on 01/13/2017), Disp: 30 tablet, Rfl: 11  Review of Systems  Constitutional: Negative.   Respiratory: Negative.   Cardiovascular: Negative for chest pain, palpitations and leg swelling.  Neurological: Negative.   Psychiatric/Behavioral: Positive for confusion and  decreased concentration. Negative for agitation, behavioral problems, hallucinations, self-injury, sleep disturbance and suicidal ideas. The patient is not nervous/anxious and is not hyperactive.     Social History   Tobacco Use  . Smoking status: Former Smoker    Types: Cigarettes  . Smokeless tobacco: Former Network engineer Use Topics  . Alcohol use: No    Alcohol/week: 0.0 oz   Objective:   BP 132/72 (BP Location: Left Arm, Patient Position: Sitting, Cuff Size: Normal)   Pulse (!) 52   Temp 97.9 F (36.6 C)   Resp 16   Ht 6\' 1"  (1.854 m)   Wt 171 lb (77.6 kg)   SpO2 98%   BMI 22.56 kg/m  Vitals:   04/13/17 1127  BP: 132/72  Pulse: (!) 52  Resp: 16  Temp: 97.9 F (36.6 C)  SpO2: 98%  Weight: 171 lb (77.6 kg)  Height: 6\' 1"  (1.854 m)     Physical Exam   General Appearance:    Alert, cooperative, no distress  Eyes:    PERRL, conjunctiva/corneas clear, EOM's intact       Lungs:     Clear to auscultation bilaterally, respirations unlabored  Heart:    Regular rate and rhythm  Neurologic:   Awake, alert, oriented x 1. No apparent focal neurological           defect.           Assessment & Plan:  1. Late onset Alzheimer's disease with behavioral disturbance Patient no longer manageable at home or at daycare facility. Requires skilled nursing. Completed FL-2 form and MSW working on placement.   2. Urinary frequency Well controlled on OTC Azo  3. Glaucoma, unspecified glaucoma type, unspecified laterality Follow up Dr. Wallace Going as scheduled.        Lelon Huh, MD  [facility]

## 2017-04-20 ENCOUNTER — Other Ambulatory Visit: Payer: Self-pay | Admitting: *Deleted

## 2017-04-20 NOTE — Patient Outreach (Addendum)
[facility]) Care Management  04/20/2017  Stephen Lewis. Jul 02, 1932 [id number]   Referral received : 2/18 Referral source : [facility] Referral reason : Need  PASSR for admit to  memory care unit.  Unsuccessful attempt to contact patient/wife , able to leave a HIPAA compliant message requesting a return call.   Collaboration call to [employer], [facility]  LCSW regarding referral need for [facility], [facility] LCSW are unable to complete PASSR.  Chrystal able to make phone call to [facility] facility wife has selected to inquire regarding whether they are able to complete, spoke with discharge planner/admissions coordinator,that verified they are able to complete PASSR and are working with PCP office for additional information needed .    Placed call to Etta Grandchild, to discuss referral , she confirms that she has received confirmation that [facility] center will be able to complete PASSR for admission to that memory care facility. Discussed with Stephen [facility] LCSW are unable to do PASSR function .    1600  Return call from patient wife, Stephen Lewis, HIPAA information verified. Discussed with wife conversation with Stephen Lewis and [facility], Admissions regarding PASSR. Wife states she has spoken with Stephen also today and grateful for her assistance in being the go between person for getting the needed information processed.  Discussed with wife [facility] care management services , she states getting patient into memory care is her main concern at this time and it  looks like it is being worked out, but in case it doesn't I may need  [facility] services help Wife request follow up call in the next week to see if [facility] care management services needed.    Plan Will plan follow up call in the next week, to discuss agreement to [facility] services.      Joylene Draft, RN, [place] Management Coordinator  [phone]- Mobile [phone]- Toll Free Main  Office

## 2017-04-27 ENCOUNTER — Other Ambulatory Visit: Payer: Self-pay | Admitting: *Deleted

## 2017-04-27 NOTE — Patient Outreach (Signed)
[facility]) Care Management  04/27/2017  Deforrest Bogle 1932-03-15 [id number]   Referral received : 2/18 Referral source : [facility] Referral reason : Need  PASSR for admit to  memory care unit.  Per chart review patient with history of Alzheimer's , Dementia , Glaucoma   Successful follow up call to patient wife, HIPAA information verified.  Wife discussed she is still waiting on PASSR number from [facility] for memory care unit. She discussed that she may also check out a facility in [facility] center. Wife discussed process is a little confusing and she is not sure next steps to take .   Wife discussed different Alzheimer's resources she has been involved with , Dementia services through [facility] [place] , spoke with Dementia specialist Lutricia Horsfall, program through [place], looking into ALF. Wife states patient is no longer assisted living level he will require skilled. She discussed difficulty with navigating system what to do next.  Wife discussed day to day challenges with care at home, having to hide his shoes, so he want walk out,  having to do everything in the home  and care for patient. Reports when she gives him medications she has to tell him they are vitamins. Wife has her son support to help around the house   Assessment.   Wife main goal is to be able to and get patient in a safe place. She is unsure of [facility] social worker need for additional resources in community .  She states she has been working with  Etta Grandchild at PCP office regarding  Placement .  Placed care coordination call to Amy Baker regarding placement if [facility] services needed to assist, able to leave a message requesting return call.   Plan Will await return call from [place] and follow up with Mrs.Finlay regarding her interest in  [facility] services within the next 3 days .    Joylene Draft, RN, [place] Management Coordinator   [phone]- Mobile [phone]- Toll Free Main Office

## 2017-04-28 ENCOUNTER — Other Ambulatory Visit: Payer: Self-pay | Admitting: *Deleted

## 2017-04-28 NOTE — Patient Outreach (Addendum)
[facility]) Care Management  04/28/2017  Jobany Montellano. 01/23/1933 [id number]   Telephone assessment  Late entry for 2/26 5 pm  Received return call from Etta Grandchild, C3 regarding patient, called placed on 2/26 to after speaking with patient wife Giordan Fordham, she suggested that I speak with Amy .  Amy confirms patient has bed offers for [facility] and [facility] in [place] and they will handle obtaining PASSR that was the original request to [facility], that our LCSW are not able to do.. Amy discusses plans are in place, once wife decides which facility she will chose. Patient is also on waiting list at [facility].  Discussed with Amy, wife has declined need for additional community services need at this time  while she is awaiting placement for patient .   Discussed with Amy I will follow up with wife again in the next 3 days and if she does not want  [facility] services at this time will close case, and will send successful outreach letter if further needs arise.   Addendum  This am , Listened to voice mail message received 2/26 at 1745 from [place], message states [facility] has received PASSR number and will accept patient when wife agreeable.  Plan Will contact wife in the next 3 days for case closure.    Joylene Draft, RN, [place] Management Coordinator  [phone]- Mobile [phone]- Toll Free Main Office

## 2017-04-30 ENCOUNTER — Other Ambulatory Visit: Payer: Self-pay | Admitting: *Deleted

## 2017-04-30 NOTE — Patient Outreach (Signed)
[facility]) Care Management  04/30/2017  Loic Hobin. [birth year] [id number]   Case Closure   Placed call to patient/wife , no answer able to leave a HIPAA compliant message.  1630  Received return call, from Mrs. Delrae Cropley, HIPAA information verified. She states things had worked out and patient will be admitted to [facility] on 3/5. She denies any further needs at this time.  Plan  Will send message to [place] regarding case status, as non active patient declines need for services at this time.   Joylene Draft, RN, [place] Management Coordinator  [phone]- Mobile [phone]- Toll Free Main Office

## 2017-05-03 DIAGNOSIS — H401131 Primary open-angle glaucoma, bilateral, mild stage: Secondary | ICD-10-CM | POA: Diagnosis not present

## 2017-05-04 DIAGNOSIS — G301 Alzheimer's disease with late onset: Secondary | ICD-10-CM | POA: Diagnosis not present

## 2017-05-04 DIAGNOSIS — H4010X0 Unspecified open-angle glaucoma, stage unspecified: Secondary | ICD-10-CM | POA: Diagnosis not present

## 2017-05-04 DIAGNOSIS — N3289 Other specified disorders of bladder: Secondary | ICD-10-CM | POA: Diagnosis not present

## 2017-05-04 DIAGNOSIS — E785 Hyperlipidemia, unspecified: Secondary | ICD-10-CM | POA: Diagnosis not present

## 2017-05-05 DIAGNOSIS — H44513 Absolute glaucoma, bilateral: Secondary | ICD-10-CM | POA: Diagnosis not present

## 2017-05-05 DIAGNOSIS — E785 Hyperlipidemia, unspecified: Secondary | ICD-10-CM | POA: Diagnosis not present

## 2017-05-05 DIAGNOSIS — G301 Alzheimer's disease with late onset: Secondary | ICD-10-CM | POA: Diagnosis not present

## 2017-05-05 DIAGNOSIS — M6281 Muscle weakness (generalized): Secondary | ICD-10-CM | POA: Diagnosis not present

## 2017-05-06 DIAGNOSIS — L299 Pruritus, unspecified: Secondary | ICD-10-CM | POA: Diagnosis not present

## 2017-05-06 DIAGNOSIS — R54 Age-related physical debility: Secondary | ICD-10-CM | POA: Diagnosis not present

## 2017-05-06 DIAGNOSIS — M6281 Muscle weakness (generalized): Secondary | ICD-10-CM | POA: Diagnosis not present

## 2017-05-06 DIAGNOSIS — H4010X0 Unspecified open-angle glaucoma, stage unspecified: Secondary | ICD-10-CM | POA: Diagnosis not present

## 2017-05-06 DIAGNOSIS — N3289 Other specified disorders of bladder: Secondary | ICD-10-CM | POA: Diagnosis not present

## 2017-05-06 DIAGNOSIS — F0391 Unspecified dementia with behavioral disturbance: Secondary | ICD-10-CM | POA: Diagnosis not present

## 2017-05-07 DIAGNOSIS — H44513 Absolute glaucoma, bilateral: Secondary | ICD-10-CM | POA: Diagnosis not present

## 2017-05-07 DIAGNOSIS — E785 Hyperlipidemia, unspecified: Secondary | ICD-10-CM | POA: Diagnosis not present

## 2017-05-07 DIAGNOSIS — M6281 Muscle weakness (generalized): Secondary | ICD-10-CM | POA: Diagnosis not present

## 2017-05-07 DIAGNOSIS — G301 Alzheimer's disease with late onset: Secondary | ICD-10-CM | POA: Diagnosis not present

## 2017-05-10 DIAGNOSIS — G301 Alzheimer's disease with late onset: Secondary | ICD-10-CM | POA: Diagnosis not present

## 2017-05-10 DIAGNOSIS — H44513 Absolute glaucoma, bilateral: Secondary | ICD-10-CM | POA: Diagnosis not present

## 2017-05-10 DIAGNOSIS — E785 Hyperlipidemia, unspecified: Secondary | ICD-10-CM | POA: Diagnosis not present

## 2017-05-10 DIAGNOSIS — M6281 Muscle weakness (generalized): Secondary | ICD-10-CM | POA: Diagnosis not present

## 2017-05-11 DIAGNOSIS — H44513 Absolute glaucoma, bilateral: Secondary | ICD-10-CM | POA: Diagnosis not present

## 2017-05-11 DIAGNOSIS — M6281 Muscle weakness (generalized): Secondary | ICD-10-CM | POA: Diagnosis not present

## 2017-05-11 DIAGNOSIS — G301 Alzheimer's disease with late onset: Secondary | ICD-10-CM | POA: Diagnosis not present

## 2017-05-11 DIAGNOSIS — E785 Hyperlipidemia, unspecified: Secondary | ICD-10-CM | POA: Diagnosis not present

## 2017-05-12 DIAGNOSIS — H44513 Absolute glaucoma, bilateral: Secondary | ICD-10-CM | POA: Diagnosis not present

## 2017-05-12 DIAGNOSIS — E785 Hyperlipidemia, unspecified: Secondary | ICD-10-CM | POA: Diagnosis not present

## 2017-05-12 DIAGNOSIS — G301 Alzheimer's disease with late onset: Secondary | ICD-10-CM | POA: Diagnosis not present

## 2017-05-12 DIAGNOSIS — M6281 Muscle weakness (generalized): Secondary | ICD-10-CM | POA: Diagnosis not present

## 2017-05-13 DIAGNOSIS — G301 Alzheimer's disease with late onset: Secondary | ICD-10-CM | POA: Diagnosis not present

## 2017-05-13 DIAGNOSIS — F0281 Dementia in other diseases classified elsewhere with behavioral disturbance: Secondary | ICD-10-CM | POA: Diagnosis not present

## 2017-05-13 DIAGNOSIS — R600 Localized edema: Secondary | ICD-10-CM | POA: Diagnosis not present

## 2017-05-13 DIAGNOSIS — M6281 Muscle weakness (generalized): Secondary | ICD-10-CM | POA: Diagnosis not present

## 2017-05-13 DIAGNOSIS — E785 Hyperlipidemia, unspecified: Secondary | ICD-10-CM | POA: Diagnosis not present

## 2017-05-13 DIAGNOSIS — L299 Pruritus, unspecified: Secondary | ICD-10-CM | POA: Diagnosis not present

## 2017-05-13 DIAGNOSIS — H4010X0 Unspecified open-angle glaucoma, stage unspecified: Secondary | ICD-10-CM | POA: Diagnosis not present

## 2017-05-13 DIAGNOSIS — F0391 Unspecified dementia with behavioral disturbance: Secondary | ICD-10-CM | POA: Diagnosis not present

## 2017-05-13 DIAGNOSIS — H44513 Absolute glaucoma, bilateral: Secondary | ICD-10-CM | POA: Diagnosis not present

## 2017-05-13 DIAGNOSIS — N3289 Other specified disorders of bladder: Secondary | ICD-10-CM | POA: Diagnosis not present

## 2017-05-13 DIAGNOSIS — R54 Age-related physical debility: Secondary | ICD-10-CM | POA: Diagnosis not present

## 2017-05-14 DIAGNOSIS — H44513 Absolute glaucoma, bilateral: Secondary | ICD-10-CM | POA: Diagnosis not present

## 2017-05-14 DIAGNOSIS — E785 Hyperlipidemia, unspecified: Secondary | ICD-10-CM | POA: Diagnosis not present

## 2017-05-14 DIAGNOSIS — M6281 Muscle weakness (generalized): Secondary | ICD-10-CM | POA: Diagnosis not present

## 2017-05-14 DIAGNOSIS — G301 Alzheimer's disease with late onset: Secondary | ICD-10-CM | POA: Diagnosis not present

## 2017-05-17 DIAGNOSIS — H44513 Absolute glaucoma, bilateral: Secondary | ICD-10-CM | POA: Diagnosis not present

## 2017-05-17 DIAGNOSIS — M6281 Muscle weakness (generalized): Secondary | ICD-10-CM | POA: Diagnosis not present

## 2017-05-17 DIAGNOSIS — G301 Alzheimer's disease with late onset: Secondary | ICD-10-CM | POA: Diagnosis not present

## 2017-05-17 DIAGNOSIS — E785 Hyperlipidemia, unspecified: Secondary | ICD-10-CM | POA: Diagnosis not present

## 2017-05-18 DIAGNOSIS — L299 Pruritus, unspecified: Secondary | ICD-10-CM | POA: Diagnosis not present

## 2017-05-18 DIAGNOSIS — G301 Alzheimer's disease with late onset: Secondary | ICD-10-CM | POA: Diagnosis not present

## 2017-05-18 DIAGNOSIS — M6281 Muscle weakness (generalized): Secondary | ICD-10-CM | POA: Diagnosis not present

## 2017-05-18 DIAGNOSIS — H4010X0 Unspecified open-angle glaucoma, stage unspecified: Secondary | ICD-10-CM | POA: Diagnosis not present

## 2017-05-18 DIAGNOSIS — E559 Vitamin D deficiency, unspecified: Secondary | ICD-10-CM | POA: Diagnosis not present

## 2017-05-18 DIAGNOSIS — H44513 Absolute glaucoma, bilateral: Secondary | ICD-10-CM | POA: Diagnosis not present

## 2017-05-18 DIAGNOSIS — E785 Hyperlipidemia, unspecified: Secondary | ICD-10-CM | POA: Diagnosis not present

## 2017-05-18 DIAGNOSIS — R600 Localized edema: Secondary | ICD-10-CM | POA: Diagnosis not present

## 2017-05-18 DIAGNOSIS — N3289 Other specified disorders of bladder: Secondary | ICD-10-CM | POA: Diagnosis not present

## 2017-05-18 DIAGNOSIS — R54 Age-related physical debility: Secondary | ICD-10-CM | POA: Diagnosis not present

## 2017-05-19 DIAGNOSIS — G301 Alzheimer's disease with late onset: Secondary | ICD-10-CM | POA: Diagnosis not present

## 2017-05-19 DIAGNOSIS — E785 Hyperlipidemia, unspecified: Secondary | ICD-10-CM | POA: Diagnosis not present

## 2017-05-19 DIAGNOSIS — H44513 Absolute glaucoma, bilateral: Secondary | ICD-10-CM | POA: Diagnosis not present

## 2017-05-19 DIAGNOSIS — M6281 Muscle weakness (generalized): Secondary | ICD-10-CM | POA: Diagnosis not present

## 2017-05-20 DIAGNOSIS — H44513 Absolute glaucoma, bilateral: Secondary | ICD-10-CM | POA: Diagnosis not present

## 2017-05-20 DIAGNOSIS — E785 Hyperlipidemia, unspecified: Secondary | ICD-10-CM | POA: Diagnosis not present

## 2017-05-20 DIAGNOSIS — G301 Alzheimer's disease with late onset: Secondary | ICD-10-CM | POA: Diagnosis not present

## 2017-05-20 DIAGNOSIS — M6281 Muscle weakness (generalized): Secondary | ICD-10-CM | POA: Diagnosis not present

## 2017-05-21 DIAGNOSIS — H44513 Absolute glaucoma, bilateral: Secondary | ICD-10-CM | POA: Diagnosis not present

## 2017-05-21 DIAGNOSIS — G301 Alzheimer's disease with late onset: Secondary | ICD-10-CM | POA: Diagnosis not present

## 2017-05-21 DIAGNOSIS — E785 Hyperlipidemia, unspecified: Secondary | ICD-10-CM | POA: Diagnosis not present

## 2017-05-21 DIAGNOSIS — M6281 Muscle weakness (generalized): Secondary | ICD-10-CM | POA: Diagnosis not present

## 2017-05-24 DIAGNOSIS — G301 Alzheimer's disease with late onset: Secondary | ICD-10-CM | POA: Diagnosis not present

## 2017-05-24 DIAGNOSIS — M6281 Muscle weakness (generalized): Secondary | ICD-10-CM | POA: Diagnosis not present

## 2017-05-24 DIAGNOSIS — H44513 Absolute glaucoma, bilateral: Secondary | ICD-10-CM | POA: Diagnosis not present

## 2017-05-24 DIAGNOSIS — E785 Hyperlipidemia, unspecified: Secondary | ICD-10-CM | POA: Diagnosis not present

## 2017-05-24 DIAGNOSIS — H10023 Other mucopurulent conjunctivitis, bilateral: Secondary | ICD-10-CM | POA: Diagnosis not present

## 2017-05-24 DIAGNOSIS — J069 Acute upper respiratory infection, unspecified: Secondary | ICD-10-CM | POA: Diagnosis not present

## 2017-05-25 DIAGNOSIS — M6281 Muscle weakness (generalized): Secondary | ICD-10-CM | POA: Diagnosis not present

## 2017-05-25 DIAGNOSIS — G301 Alzheimer's disease with late onset: Secondary | ICD-10-CM | POA: Diagnosis not present

## 2017-05-25 DIAGNOSIS — H44513 Absolute glaucoma, bilateral: Secondary | ICD-10-CM | POA: Diagnosis not present

## 2017-05-25 DIAGNOSIS — E785 Hyperlipidemia, unspecified: Secondary | ICD-10-CM | POA: Diagnosis not present

## 2017-05-26 DIAGNOSIS — E785 Hyperlipidemia, unspecified: Secondary | ICD-10-CM | POA: Diagnosis not present

## 2017-05-26 DIAGNOSIS — G301 Alzheimer's disease with late onset: Secondary | ICD-10-CM | POA: Diagnosis not present

## 2017-05-26 DIAGNOSIS — H44513 Absolute glaucoma, bilateral: Secondary | ICD-10-CM | POA: Diagnosis not present

## 2017-05-26 DIAGNOSIS — M6281 Muscle weakness (generalized): Secondary | ICD-10-CM | POA: Diagnosis not present

## 2017-05-27 DIAGNOSIS — H44513 Absolute glaucoma, bilateral: Secondary | ICD-10-CM | POA: Diagnosis not present

## 2017-05-27 DIAGNOSIS — G301 Alzheimer's disease with late onset: Secondary | ICD-10-CM | POA: Diagnosis not present

## 2017-05-27 DIAGNOSIS — E785 Hyperlipidemia, unspecified: Secondary | ICD-10-CM | POA: Diagnosis not present

## 2017-05-27 DIAGNOSIS — J069 Acute upper respiratory infection, unspecified: Secondary | ICD-10-CM | POA: Diagnosis not present

## 2017-05-27 DIAGNOSIS — M6281 Muscle weakness (generalized): Secondary | ICD-10-CM | POA: Diagnosis not present

## 2017-05-27 DIAGNOSIS — F0281 Dementia in other diseases classified elsewhere with behavioral disturbance: Secondary | ICD-10-CM | POA: Diagnosis not present

## 2017-05-28 DIAGNOSIS — G301 Alzheimer's disease with late onset: Secondary | ICD-10-CM | POA: Diagnosis not present

## 2017-05-28 DIAGNOSIS — H44513 Absolute glaucoma, bilateral: Secondary | ICD-10-CM | POA: Diagnosis not present

## 2017-05-28 DIAGNOSIS — E785 Hyperlipidemia, unspecified: Secondary | ICD-10-CM | POA: Diagnosis not present

## 2017-05-28 DIAGNOSIS — M6281 Muscle weakness (generalized): Secondary | ICD-10-CM | POA: Diagnosis not present

## 2017-05-31 DIAGNOSIS — M6281 Muscle weakness (generalized): Secondary | ICD-10-CM | POA: Diagnosis not present

## 2017-05-31 DIAGNOSIS — E785 Hyperlipidemia, unspecified: Secondary | ICD-10-CM | POA: Diagnosis not present

## 2017-05-31 DIAGNOSIS — H44513 Absolute glaucoma, bilateral: Secondary | ICD-10-CM | POA: Diagnosis not present

## 2017-05-31 DIAGNOSIS — G301 Alzheimer's disease with late onset: Secondary | ICD-10-CM | POA: Diagnosis not present

## 2017-06-01 DIAGNOSIS — E785 Hyperlipidemia, unspecified: Secondary | ICD-10-CM | POA: Diagnosis not present

## 2017-06-01 DIAGNOSIS — G301 Alzheimer's disease with late onset: Secondary | ICD-10-CM | POA: Diagnosis not present

## 2017-06-01 DIAGNOSIS — M6281 Muscle weakness (generalized): Secondary | ICD-10-CM | POA: Diagnosis not present

## 2017-06-01 DIAGNOSIS — H44513 Absolute glaucoma, bilateral: Secondary | ICD-10-CM | POA: Diagnosis not present

## 2017-06-02 DIAGNOSIS — H44513 Absolute glaucoma, bilateral: Secondary | ICD-10-CM | POA: Diagnosis not present

## 2017-06-02 DIAGNOSIS — G309 Alzheimer's disease, unspecified: Secondary | ICD-10-CM | POA: Diagnosis not present

## 2017-06-02 DIAGNOSIS — G301 Alzheimer's disease with late onset: Secondary | ICD-10-CM | POA: Diagnosis not present

## 2017-06-02 DIAGNOSIS — R54 Age-related physical debility: Secondary | ICD-10-CM | POA: Diagnosis not present

## 2017-06-02 DIAGNOSIS — M6281 Muscle weakness (generalized): Secondary | ICD-10-CM | POA: Diagnosis not present

## 2017-06-02 DIAGNOSIS — E785 Hyperlipidemia, unspecified: Secondary | ICD-10-CM | POA: Diagnosis not present

## 2017-06-03 DIAGNOSIS — H44513 Absolute glaucoma, bilateral: Secondary | ICD-10-CM | POA: Diagnosis not present

## 2017-06-03 DIAGNOSIS — G301 Alzheimer's disease with late onset: Secondary | ICD-10-CM | POA: Diagnosis not present

## 2017-06-03 DIAGNOSIS — E785 Hyperlipidemia, unspecified: Secondary | ICD-10-CM | POA: Diagnosis not present

## 2017-06-03 DIAGNOSIS — E559 Vitamin D deficiency, unspecified: Secondary | ICD-10-CM | POA: Diagnosis not present

## 2017-06-03 DIAGNOSIS — R54 Age-related physical debility: Secondary | ICD-10-CM | POA: Diagnosis not present

## 2017-06-03 DIAGNOSIS — M6281 Muscle weakness (generalized): Secondary | ICD-10-CM | POA: Diagnosis not present

## 2017-06-03 DIAGNOSIS — Z79899 Other long term (current) drug therapy: Secondary | ICD-10-CM | POA: Diagnosis not present

## 2017-06-03 DIAGNOSIS — D649 Anemia, unspecified: Secondary | ICD-10-CM | POA: Diagnosis not present

## 2017-06-03 DIAGNOSIS — R600 Localized edema: Secondary | ICD-10-CM | POA: Diagnosis not present

## 2017-06-03 DIAGNOSIS — R41841 Cognitive communication deficit: Secondary | ICD-10-CM | POA: Diagnosis not present

## 2017-06-04 DIAGNOSIS — N390 Urinary tract infection, site not specified: Secondary | ICD-10-CM | POA: Diagnosis not present

## 2017-06-04 DIAGNOSIS — E559 Vitamin D deficiency, unspecified: Secondary | ICD-10-CM | POA: Diagnosis not present

## 2017-06-04 DIAGNOSIS — R54 Age-related physical debility: Secondary | ICD-10-CM | POA: Diagnosis not present

## 2017-06-04 DIAGNOSIS — H409 Unspecified glaucoma: Secondary | ICD-10-CM | POA: Diagnosis not present

## 2017-06-04 DIAGNOSIS — N3289 Other specified disorders of bladder: Secondary | ICD-10-CM | POA: Diagnosis not present

## 2017-06-04 DIAGNOSIS — R41841 Cognitive communication deficit: Secondary | ICD-10-CM | POA: Diagnosis not present

## 2017-06-04 DIAGNOSIS — E785 Hyperlipidemia, unspecified: Secondary | ICD-10-CM | POA: Diagnosis not present

## 2017-06-04 DIAGNOSIS — M6281 Muscle weakness (generalized): Secondary | ICD-10-CM | POA: Diagnosis not present

## 2017-06-04 DIAGNOSIS — F0391 Unspecified dementia with behavioral disturbance: Secondary | ICD-10-CM | POA: Diagnosis not present

## 2017-06-04 DIAGNOSIS — H44513 Absolute glaucoma, bilateral: Secondary | ICD-10-CM | POA: Diagnosis not present

## 2017-06-04 DIAGNOSIS — R319 Hematuria, unspecified: Secondary | ICD-10-CM | POA: Diagnosis not present

## 2017-06-04 DIAGNOSIS — Z79899 Other long term (current) drug therapy: Secondary | ICD-10-CM | POA: Diagnosis not present

## 2017-06-04 DIAGNOSIS — G301 Alzheimer's disease with late onset: Secondary | ICD-10-CM | POA: Diagnosis not present

## 2017-06-05 DIAGNOSIS — R41841 Cognitive communication deficit: Secondary | ICD-10-CM | POA: Diagnosis not present

## 2017-06-05 DIAGNOSIS — E785 Hyperlipidemia, unspecified: Secondary | ICD-10-CM | POA: Diagnosis not present

## 2017-06-05 DIAGNOSIS — M6281 Muscle weakness (generalized): Secondary | ICD-10-CM | POA: Diagnosis not present

## 2017-06-05 DIAGNOSIS — H44513 Absolute glaucoma, bilateral: Secondary | ICD-10-CM | POA: Diagnosis not present

## 2017-06-05 DIAGNOSIS — G301 Alzheimer's disease with late onset: Secondary | ICD-10-CM | POA: Diagnosis not present

## 2017-06-07 DIAGNOSIS — R41841 Cognitive communication deficit: Secondary | ICD-10-CM | POA: Diagnosis not present

## 2017-06-07 DIAGNOSIS — E785 Hyperlipidemia, unspecified: Secondary | ICD-10-CM | POA: Diagnosis not present

## 2017-06-07 DIAGNOSIS — H44513 Absolute glaucoma, bilateral: Secondary | ICD-10-CM | POA: Diagnosis not present

## 2017-06-07 DIAGNOSIS — M6281 Muscle weakness (generalized): Secondary | ICD-10-CM | POA: Diagnosis not present

## 2017-06-07 DIAGNOSIS — G301 Alzheimer's disease with late onset: Secondary | ICD-10-CM | POA: Diagnosis not present

## 2017-06-08 DIAGNOSIS — M6281 Muscle weakness (generalized): Secondary | ICD-10-CM | POA: Diagnosis not present

## 2017-06-08 DIAGNOSIS — H44513 Absolute glaucoma, bilateral: Secondary | ICD-10-CM | POA: Diagnosis not present

## 2017-06-08 DIAGNOSIS — G301 Alzheimer's disease with late onset: Secondary | ICD-10-CM | POA: Diagnosis not present

## 2017-06-08 DIAGNOSIS — R41841 Cognitive communication deficit: Secondary | ICD-10-CM | POA: Diagnosis not present

## 2017-06-08 DIAGNOSIS — E785 Hyperlipidemia, unspecified: Secondary | ICD-10-CM | POA: Diagnosis not present

## 2017-06-09 DIAGNOSIS — H44513 Absolute glaucoma, bilateral: Secondary | ICD-10-CM | POA: Diagnosis not present

## 2017-06-09 DIAGNOSIS — M6281 Muscle weakness (generalized): Secondary | ICD-10-CM | POA: Diagnosis not present

## 2017-06-09 DIAGNOSIS — R41841 Cognitive communication deficit: Secondary | ICD-10-CM | POA: Diagnosis not present

## 2017-06-09 DIAGNOSIS — E785 Hyperlipidemia, unspecified: Secondary | ICD-10-CM | POA: Diagnosis not present

## 2017-06-09 DIAGNOSIS — G301 Alzheimer's disease with late onset: Secondary | ICD-10-CM | POA: Diagnosis not present

## 2017-06-10 DIAGNOSIS — M6281 Muscle weakness (generalized): Secondary | ICD-10-CM | POA: Diagnosis not present

## 2017-06-10 DIAGNOSIS — R634 Abnormal weight loss: Secondary | ICD-10-CM | POA: Diagnosis not present

## 2017-06-10 DIAGNOSIS — F0280 Dementia in other diseases classified elsewhere without behavioral disturbance: Secondary | ICD-10-CM | POA: Diagnosis not present

## 2017-06-10 DIAGNOSIS — E785 Hyperlipidemia, unspecified: Secondary | ICD-10-CM | POA: Diagnosis not present

## 2017-06-10 DIAGNOSIS — R41841 Cognitive communication deficit: Secondary | ICD-10-CM | POA: Diagnosis not present

## 2017-06-10 DIAGNOSIS — G301 Alzheimer's disease with late onset: Secondary | ICD-10-CM | POA: Diagnosis not present

## 2017-06-10 DIAGNOSIS — H44513 Absolute glaucoma, bilateral: Secondary | ICD-10-CM | POA: Diagnosis not present

## 2017-06-10 DIAGNOSIS — G309 Alzheimer's disease, unspecified: Secondary | ICD-10-CM | POA: Diagnosis not present

## 2017-06-11 DIAGNOSIS — R41841 Cognitive communication deficit: Secondary | ICD-10-CM | POA: Diagnosis not present

## 2017-06-11 DIAGNOSIS — J189 Pneumonia, unspecified organism: Secondary | ICD-10-CM | POA: Diagnosis not present

## 2017-06-11 DIAGNOSIS — E785 Hyperlipidemia, unspecified: Secondary | ICD-10-CM | POA: Diagnosis not present

## 2017-06-11 DIAGNOSIS — G301 Alzheimer's disease with late onset: Secondary | ICD-10-CM | POA: Diagnosis not present

## 2017-06-11 DIAGNOSIS — M6281 Muscle weakness (generalized): Secondary | ICD-10-CM | POA: Diagnosis not present

## 2017-06-11 DIAGNOSIS — H44513 Absolute glaucoma, bilateral: Secondary | ICD-10-CM | POA: Diagnosis not present

## 2017-06-13 DIAGNOSIS — G301 Alzheimer's disease with late onset: Secondary | ICD-10-CM | POA: Diagnosis not present

## 2017-06-13 DIAGNOSIS — M6281 Muscle weakness (generalized): Secondary | ICD-10-CM | POA: Diagnosis not present

## 2017-06-13 DIAGNOSIS — E785 Hyperlipidemia, unspecified: Secondary | ICD-10-CM | POA: Diagnosis not present

## 2017-06-13 DIAGNOSIS — R41841 Cognitive communication deficit: Secondary | ICD-10-CM | POA: Diagnosis not present

## 2017-06-13 DIAGNOSIS — H44513 Absolute glaucoma, bilateral: Secondary | ICD-10-CM | POA: Diagnosis not present

## 2017-06-14 DIAGNOSIS — R41841 Cognitive communication deficit: Secondary | ICD-10-CM | POA: Diagnosis not present

## 2017-06-14 DIAGNOSIS — H44513 Absolute glaucoma, bilateral: Secondary | ICD-10-CM | POA: Diagnosis not present

## 2017-06-14 DIAGNOSIS — M6281 Muscle weakness (generalized): Secondary | ICD-10-CM | POA: Diagnosis not present

## 2017-06-14 DIAGNOSIS — E7800 Pure hypercholesterolemia, unspecified: Secondary | ICD-10-CM | POA: Diagnosis not present

## 2017-06-14 DIAGNOSIS — E785 Hyperlipidemia, unspecified: Secondary | ICD-10-CM | POA: Diagnosis not present

## 2017-06-14 DIAGNOSIS — E039 Hypothyroidism, unspecified: Secondary | ICD-10-CM | POA: Diagnosis not present

## 2017-06-14 DIAGNOSIS — Z79899 Other long term (current) drug therapy: Secondary | ICD-10-CM | POA: Diagnosis not present

## 2017-06-14 DIAGNOSIS — G301 Alzheimer's disease with late onset: Secondary | ICD-10-CM | POA: Diagnosis not present

## 2017-06-15 DIAGNOSIS — R41841 Cognitive communication deficit: Secondary | ICD-10-CM | POA: Diagnosis not present

## 2017-06-15 DIAGNOSIS — H409 Unspecified glaucoma: Secondary | ICD-10-CM | POA: Diagnosis present

## 2017-06-15 DIAGNOSIS — H44513 Absolute glaucoma, bilateral: Secondary | ICD-10-CM | POA: Diagnosis not present

## 2017-06-15 DIAGNOSIS — E559 Vitamin D deficiency, unspecified: Secondary | ICD-10-CM | POA: Diagnosis not present

## 2017-06-15 DIAGNOSIS — N3289 Other specified disorders of bladder: Secondary | ICD-10-CM | POA: Diagnosis not present

## 2017-06-15 DIAGNOSIS — H4010X0 Unspecified open-angle glaucoma, stage unspecified: Secondary | ICD-10-CM | POA: Diagnosis not present

## 2017-06-15 DIAGNOSIS — E785 Hyperlipidemia, unspecified: Secondary | ICD-10-CM | POA: Diagnosis not present

## 2017-06-15 DIAGNOSIS — Z6822 Body mass index (BMI) 22.0-22.9, adult: Secondary | ICD-10-CM | POA: Diagnosis not present

## 2017-06-15 DIAGNOSIS — R0600 Dyspnea, unspecified: Secondary | ICD-10-CM | POA: Diagnosis not present

## 2017-06-15 DIAGNOSIS — R531 Weakness: Secondary | ICD-10-CM | POA: Diagnosis not present

## 2017-06-15 DIAGNOSIS — E440 Moderate protein-calorie malnutrition: Secondary | ICD-10-CM | POA: Diagnosis not present

## 2017-06-15 DIAGNOSIS — E860 Dehydration: Secondary | ICD-10-CM | POA: Diagnosis present

## 2017-06-15 DIAGNOSIS — R1312 Dysphagia, oropharyngeal phase: Secondary | ICD-10-CM | POA: Diagnosis not present

## 2017-06-15 DIAGNOSIS — G309 Alzheimer's disease, unspecified: Secondary | ICD-10-CM | POA: Diagnosis not present

## 2017-06-15 DIAGNOSIS — R609 Edema, unspecified: Secondary | ICD-10-CM | POA: Diagnosis not present

## 2017-06-15 DIAGNOSIS — Z741 Need for assistance with personal care: Secondary | ICD-10-CM | POA: Diagnosis not present

## 2017-06-15 DIAGNOSIS — R509 Fever, unspecified: Secondary | ICD-10-CM | POA: Diagnosis not present

## 2017-06-15 DIAGNOSIS — Z9049 Acquired absence of other specified parts of digestive tract: Secondary | ICD-10-CM | POA: Diagnosis not present

## 2017-06-15 DIAGNOSIS — A419 Sepsis, unspecified organism: Secondary | ICD-10-CM | POA: Diagnosis not present

## 2017-06-15 DIAGNOSIS — J180 Bronchopneumonia, unspecified organism: Secondary | ICD-10-CM | POA: Diagnosis not present

## 2017-06-15 DIAGNOSIS — G9341 Metabolic encephalopathy: Secondary | ICD-10-CM | POA: Diagnosis present

## 2017-06-15 DIAGNOSIS — R4182 Altered mental status, unspecified: Secondary | ICD-10-CM | POA: Diagnosis not present

## 2017-06-15 DIAGNOSIS — J690 Pneumonitis due to inhalation of food and vomit: Secondary | ICD-10-CM | POA: Diagnosis present

## 2017-06-15 DIAGNOSIS — G9340 Encephalopathy, unspecified: Secondary | ICD-10-CM | POA: Diagnosis not present

## 2017-06-15 DIAGNOSIS — Z66 Do not resuscitate: Secondary | ICD-10-CM | POA: Diagnosis not present

## 2017-06-15 DIAGNOSIS — D649 Anemia, unspecified: Secondary | ICD-10-CM | POA: Diagnosis present

## 2017-06-15 DIAGNOSIS — E872 Acidosis: Secondary | ICD-10-CM | POA: Diagnosis not present

## 2017-06-15 DIAGNOSIS — R404 Transient alteration of awareness: Secondary | ICD-10-CM | POA: Diagnosis not present

## 2017-06-15 DIAGNOSIS — R269 Unspecified abnormalities of gait and mobility: Secondary | ICD-10-CM | POA: Diagnosis not present

## 2017-06-15 DIAGNOSIS — F0280 Dementia in other diseases classified elsewhere without behavioral disturbance: Secondary | ICD-10-CM | POA: Diagnosis present

## 2017-06-15 DIAGNOSIS — R2689 Other abnormalities of gait and mobility: Secondary | ICD-10-CM | POA: Diagnosis present

## 2017-06-15 DIAGNOSIS — N179 Acute kidney failure, unspecified: Secondary | ICD-10-CM | POA: Diagnosis not present

## 2017-06-15 DIAGNOSIS — M6281 Muscle weakness (generalized): Secondary | ICD-10-CM | POA: Diagnosis not present

## 2017-06-15 DIAGNOSIS — F0281 Dementia in other diseases classified elsewhere with behavioral disturbance: Secondary | ICD-10-CM | POA: Diagnosis not present

## 2017-06-15 DIAGNOSIS — N3281 Overactive bladder: Secondary | ICD-10-CM | POA: Diagnosis present

## 2017-06-15 DIAGNOSIS — N390 Urinary tract infection, site not specified: Secondary | ICD-10-CM | POA: Diagnosis not present

## 2017-06-15 DIAGNOSIS — G3183 Dementia with Lewy bodies: Secondary | ICD-10-CM | POA: Diagnosis not present

## 2017-06-15 DIAGNOSIS — G301 Alzheimer's disease with late onset: Secondary | ICD-10-CM | POA: Diagnosis not present

## 2017-06-15 DIAGNOSIS — R41844 Frontal lobe and executive function deficit: Secondary | ICD-10-CM | POA: Diagnosis not present

## 2017-06-15 DIAGNOSIS — J189 Pneumonia, unspecified organism: Secondary | ICD-10-CM | POA: Diagnosis not present

## 2017-06-15 DIAGNOSIS — Z87440 Personal history of urinary (tract) infections: Secondary | ICD-10-CM | POA: Diagnosis not present

## 2017-06-21 DIAGNOSIS — H409 Unspecified glaucoma: Secondary | ICD-10-CM | POA: Diagnosis not present

## 2017-06-21 DIAGNOSIS — E8809 Other disorders of plasma-protein metabolism, not elsewhere classified: Secondary | ICD-10-CM | POA: Diagnosis present

## 2017-06-21 DIAGNOSIS — R402441 Other coma, without documented Glasgow coma scale score, or with partial score reported, in the field [EMT or ambulance]: Secondary | ICD-10-CM | POA: Diagnosis not present

## 2017-06-21 DIAGNOSIS — M6281 Muscle weakness (generalized): Secondary | ICD-10-CM | POA: Diagnosis not present

## 2017-06-21 DIAGNOSIS — E785 Hyperlipidemia, unspecified: Secondary | ICD-10-CM | POA: Diagnosis not present

## 2017-06-21 DIAGNOSIS — D72829 Elevated white blood cell count, unspecified: Secondary | ICD-10-CM | POA: Diagnosis not present

## 2017-06-21 DIAGNOSIS — G9341 Metabolic encephalopathy: Secondary | ICD-10-CM | POA: Diagnosis not present

## 2017-06-21 DIAGNOSIS — A499 Bacterial infection, unspecified: Secondary | ICD-10-CM | POA: Diagnosis not present

## 2017-06-21 DIAGNOSIS — R4182 Altered mental status, unspecified: Secondary | ICD-10-CM | POA: Diagnosis not present

## 2017-06-21 DIAGNOSIS — N390 Urinary tract infection, site not specified: Secondary | ICD-10-CM | POA: Diagnosis not present

## 2017-06-21 DIAGNOSIS — E43 Unspecified severe protein-calorie malnutrition: Secondary | ICD-10-CM | POA: Diagnosis not present

## 2017-06-21 DIAGNOSIS — H44513 Absolute glaucoma, bilateral: Secondary | ICD-10-CM | POA: Diagnosis not present

## 2017-06-21 DIAGNOSIS — R634 Abnormal weight loss: Secondary | ICD-10-CM | POA: Diagnosis not present

## 2017-06-21 DIAGNOSIS — E876 Hypokalemia: Secondary | ICD-10-CM | POA: Diagnosis present

## 2017-06-21 DIAGNOSIS — E440 Moderate protein-calorie malnutrition: Secondary | ICD-10-CM | POA: Diagnosis not present

## 2017-06-21 DIAGNOSIS — R7989 Other specified abnormal findings of blood chemistry: Secondary | ICD-10-CM | POA: Diagnosis not present

## 2017-06-21 DIAGNOSIS — R54 Age-related physical debility: Secondary | ICD-10-CM | POA: Diagnosis not present

## 2017-06-21 DIAGNOSIS — K5289 Other specified noninfective gastroenteritis and colitis: Secondary | ICD-10-CM | POA: Diagnosis not present

## 2017-06-21 DIAGNOSIS — R41841 Cognitive communication deficit: Secondary | ICD-10-CM | POA: Diagnosis not present

## 2017-06-21 DIAGNOSIS — R269 Unspecified abnormalities of gait and mobility: Secondary | ICD-10-CM | POA: Diagnosis present

## 2017-06-21 DIAGNOSIS — D649 Anemia, unspecified: Secondary | ICD-10-CM | POA: Diagnosis not present

## 2017-06-21 DIAGNOSIS — H4010X0 Unspecified open-angle glaucoma, stage unspecified: Secondary | ICD-10-CM | POA: Diagnosis not present

## 2017-06-21 DIAGNOSIS — G9340 Encephalopathy, unspecified: Secondary | ICD-10-CM | POA: Diagnosis not present

## 2017-06-21 DIAGNOSIS — R609 Edema, unspecified: Secondary | ICD-10-CM | POA: Diagnosis not present

## 2017-06-21 DIAGNOSIS — E870 Hyperosmolality and hypernatremia: Secondary | ICD-10-CM | POA: Diagnosis not present

## 2017-06-21 DIAGNOSIS — F0280 Dementia in other diseases classified elsewhere without behavioral disturbance: Secondary | ICD-10-CM | POA: Diagnosis not present

## 2017-06-21 DIAGNOSIS — E559 Vitamin D deficiency, unspecified: Secondary | ICD-10-CM | POA: Diagnosis not present

## 2017-06-21 DIAGNOSIS — N179 Acute kidney failure, unspecified: Secondary | ICD-10-CM | POA: Diagnosis not present

## 2017-06-21 DIAGNOSIS — N3289 Other specified disorders of bladder: Secondary | ICD-10-CM | POA: Diagnosis not present

## 2017-06-21 DIAGNOSIS — A0472 Enterocolitis due to Clostridium difficile, not specified as recurrent: Secondary | ICD-10-CM | POA: Diagnosis not present

## 2017-06-21 DIAGNOSIS — R509 Fever, unspecified: Secondary | ICD-10-CM | POA: Diagnosis not present

## 2017-06-21 DIAGNOSIS — R1312 Dysphagia, oropharyngeal phase: Secondary | ICD-10-CM | POA: Diagnosis not present

## 2017-06-21 DIAGNOSIS — G301 Alzheimer's disease with late onset: Secondary | ICD-10-CM | POA: Diagnosis not present

## 2017-06-21 DIAGNOSIS — Z741 Need for assistance with personal care: Secondary | ICD-10-CM | POA: Diagnosis not present

## 2017-06-21 DIAGNOSIS — Z87891 Personal history of nicotine dependence: Secondary | ICD-10-CM | POA: Diagnosis not present

## 2017-06-21 DIAGNOSIS — A419 Sepsis, unspecified organism: Secondary | ICD-10-CM | POA: Diagnosis not present

## 2017-06-21 DIAGNOSIS — R627 Adult failure to thrive: Secondary | ICD-10-CM | POA: Diagnosis not present

## 2017-06-21 DIAGNOSIS — Z6823 Body mass index (BMI) 23.0-23.9, adult: Secondary | ICD-10-CM | POA: Diagnosis not present

## 2017-06-21 DIAGNOSIS — E872 Acidosis: Secondary | ICD-10-CM | POA: Diagnosis not present

## 2017-06-21 DIAGNOSIS — E860 Dehydration: Secondary | ICD-10-CM | POA: Diagnosis present

## 2017-06-21 DIAGNOSIS — J189 Pneumonia, unspecified organism: Secondary | ICD-10-CM | POA: Diagnosis not present

## 2017-06-21 DIAGNOSIS — K567 Ileus, unspecified: Secondary | ICD-10-CM | POA: Diagnosis not present

## 2017-06-21 DIAGNOSIS — Z9049 Acquired absence of other specified parts of digestive tract: Secondary | ICD-10-CM | POA: Diagnosis not present

## 2017-06-21 DIAGNOSIS — R41844 Frontal lobe and executive function deficit: Secondary | ICD-10-CM | POA: Diagnosis not present

## 2017-06-21 DIAGNOSIS — F0281 Dementia in other diseases classified elsewhere with behavioral disturbance: Secondary | ICD-10-CM | POA: Diagnosis not present

## 2017-06-21 DIAGNOSIS — J90 Pleural effusion, not elsewhere classified: Secondary | ICD-10-CM | POA: Diagnosis not present

## 2017-06-21 DIAGNOSIS — G3183 Dementia with Lewy bodies: Secondary | ICD-10-CM | POA: Diagnosis not present

## 2017-06-21 DIAGNOSIS — A4189 Other specified sepsis: Secondary | ICD-10-CM | POA: Diagnosis not present

## 2017-06-21 DIAGNOSIS — G309 Alzheimer's disease, unspecified: Secondary | ICD-10-CM | POA: Diagnosis not present

## 2017-06-21 DIAGNOSIS — A09 Infectious gastroenteritis and colitis, unspecified: Secondary | ICD-10-CM | POA: Diagnosis not present

## 2017-06-21 DIAGNOSIS — N3281 Overactive bladder: Secondary | ICD-10-CM | POA: Diagnosis not present

## 2017-06-21 DIAGNOSIS — Z66 Do not resuscitate: Secondary | ICD-10-CM | POA: Diagnosis not present

## 2017-06-21 DIAGNOSIS — M810 Age-related osteoporosis without current pathological fracture: Secondary | ICD-10-CM | POA: Diagnosis not present

## 2017-06-21 DIAGNOSIS — J180 Bronchopneumonia, unspecified organism: Secondary | ICD-10-CM | POA: Diagnosis not present

## 2017-06-21 DIAGNOSIS — Z79899 Other long term (current) drug therapy: Secondary | ICD-10-CM | POA: Diagnosis not present

## 2017-06-22 DIAGNOSIS — M810 Age-related osteoporosis without current pathological fracture: Secondary | ICD-10-CM | POA: Diagnosis not present

## 2017-06-22 DIAGNOSIS — G309 Alzheimer's disease, unspecified: Secondary | ICD-10-CM | POA: Diagnosis not present

## 2017-06-22 DIAGNOSIS — E559 Vitamin D deficiency, unspecified: Secondary | ICD-10-CM | POA: Diagnosis not present

## 2017-06-22 DIAGNOSIS — R634 Abnormal weight loss: Secondary | ICD-10-CM | POA: Diagnosis not present

## 2017-06-22 DIAGNOSIS — D72829 Elevated white blood cell count, unspecified: Secondary | ICD-10-CM | POA: Diagnosis not present

## 2017-06-22 DIAGNOSIS — H4010X0 Unspecified open-angle glaucoma, stage unspecified: Secondary | ICD-10-CM | POA: Diagnosis not present

## 2017-06-22 DIAGNOSIS — A4189 Other specified sepsis: Secondary | ICD-10-CM | POA: Diagnosis not present

## 2017-06-22 DIAGNOSIS — G9341 Metabolic encephalopathy: Secondary | ICD-10-CM | POA: Diagnosis not present

## 2017-06-23 DIAGNOSIS — H409 Unspecified glaucoma: Secondary | ICD-10-CM | POA: Diagnosis not present

## 2017-06-23 DIAGNOSIS — A499 Bacterial infection, unspecified: Secondary | ICD-10-CM | POA: Diagnosis not present

## 2017-06-23 DIAGNOSIS — E559 Vitamin D deficiency, unspecified: Secondary | ICD-10-CM | POA: Diagnosis not present

## 2017-06-23 DIAGNOSIS — R627 Adult failure to thrive: Secondary | ICD-10-CM | POA: Diagnosis not present

## 2017-06-23 DIAGNOSIS — M810 Age-related osteoporosis without current pathological fracture: Secondary | ICD-10-CM | POA: Diagnosis not present

## 2017-06-23 DIAGNOSIS — D72829 Elevated white blood cell count, unspecified: Secondary | ICD-10-CM | POA: Diagnosis not present

## 2017-06-23 DIAGNOSIS — G309 Alzheimer's disease, unspecified: Secondary | ICD-10-CM | POA: Diagnosis not present

## 2017-06-23 DIAGNOSIS — R54 Age-related physical debility: Secondary | ICD-10-CM | POA: Diagnosis not present

## 2017-06-28 DIAGNOSIS — M6281 Muscle weakness (generalized): Secondary | ICD-10-CM | POA: Diagnosis not present

## 2017-06-28 DIAGNOSIS — R4182 Altered mental status, unspecified: Secondary | ICD-10-CM | POA: Diagnosis not present

## 2017-06-28 DIAGNOSIS — R609 Edema, unspecified: Secondary | ICD-10-CM | POA: Diagnosis not present

## 2017-06-28 DIAGNOSIS — F0280 Dementia in other diseases classified elsewhere without behavioral disturbance: Secondary | ICD-10-CM | POA: Diagnosis not present

## 2017-06-28 DIAGNOSIS — N3281 Overactive bladder: Secondary | ICD-10-CM | POA: Diagnosis not present

## 2017-06-28 DIAGNOSIS — H44513 Absolute glaucoma, bilateral: Secondary | ICD-10-CM | POA: Diagnosis not present

## 2017-06-28 DIAGNOSIS — D649 Anemia, unspecified: Secondary | ICD-10-CM | POA: Diagnosis not present

## 2017-06-28 DIAGNOSIS — R7989 Other specified abnormal findings of blood chemistry: Secondary | ICD-10-CM | POA: Diagnosis not present

## 2017-06-28 DIAGNOSIS — N390 Urinary tract infection, site not specified: Secondary | ICD-10-CM | POA: Diagnosis not present

## 2017-07-02 DIAGNOSIS — D72829 Elevated white blood cell count, unspecified: Secondary | ICD-10-CM | POA: Diagnosis not present

## 2017-07-02 DIAGNOSIS — E8809 Other disorders of plasma-protein metabolism, not elsewhere classified: Secondary | ICD-10-CM | POA: Diagnosis present

## 2017-07-02 DIAGNOSIS — D649 Anemia, unspecified: Secondary | ICD-10-CM | POA: Diagnosis present

## 2017-07-02 DIAGNOSIS — F0281 Dementia in other diseases classified elsewhere with behavioral disturbance: Secondary | ICD-10-CM | POA: Diagnosis not present

## 2017-07-02 DIAGNOSIS — R41844 Frontal lobe and executive function deficit: Secondary | ICD-10-CM | POA: Diagnosis not present

## 2017-07-02 DIAGNOSIS — F0280 Dementia in other diseases classified elsewhere without behavioral disturbance: Secondary | ICD-10-CM | POA: Diagnosis present

## 2017-07-02 DIAGNOSIS — A0472 Enterocolitis due to Clostridium difficile, not specified as recurrent: Secondary | ICD-10-CM | POA: Diagnosis not present

## 2017-07-02 DIAGNOSIS — R609 Edema, unspecified: Secondary | ICD-10-CM | POA: Diagnosis not present

## 2017-07-02 DIAGNOSIS — M6281 Muscle weakness (generalized): Secondary | ICD-10-CM | POA: Diagnosis not present

## 2017-07-02 DIAGNOSIS — K5289 Other specified noninfective gastroenteritis and colitis: Secondary | ICD-10-CM | POA: Diagnosis not present

## 2017-07-02 DIAGNOSIS — J90 Pleural effusion, not elsewhere classified: Secondary | ICD-10-CM | POA: Diagnosis present

## 2017-07-02 DIAGNOSIS — Z6823 Body mass index (BMI) 23.0-23.9, adult: Secondary | ICD-10-CM | POA: Diagnosis not present

## 2017-07-02 DIAGNOSIS — M150 Primary generalized (osteo)arthritis: Secondary | ICD-10-CM | POA: Diagnosis not present

## 2017-07-02 DIAGNOSIS — S0090XD Unspecified superficial injury of unspecified part of head, subsequent encounter: Secondary | ICD-10-CM | POA: Diagnosis not present

## 2017-07-02 DIAGNOSIS — G9340 Encephalopathy, unspecified: Secondary | ICD-10-CM | POA: Diagnosis not present

## 2017-07-02 DIAGNOSIS — G309 Alzheimer's disease, unspecified: Secondary | ICD-10-CM | POA: Diagnosis not present

## 2017-07-02 DIAGNOSIS — N3289 Other specified disorders of bladder: Secondary | ICD-10-CM | POA: Diagnosis not present

## 2017-07-02 DIAGNOSIS — A09 Infectious gastroenteritis and colitis, unspecified: Secondary | ICD-10-CM | POA: Diagnosis not present

## 2017-07-02 DIAGNOSIS — E43 Unspecified severe protein-calorie malnutrition: Secondary | ICD-10-CM | POA: Diagnosis not present

## 2017-07-02 DIAGNOSIS — E870 Hyperosmolality and hypernatremia: Secondary | ICD-10-CM | POA: Diagnosis not present

## 2017-07-02 DIAGNOSIS — N3281 Overactive bladder: Secondary | ICD-10-CM | POA: Diagnosis present

## 2017-07-02 DIAGNOSIS — R1312 Dysphagia, oropharyngeal phase: Secondary | ICD-10-CM | POA: Diagnosis not present

## 2017-07-02 DIAGNOSIS — H4010X0 Unspecified open-angle glaucoma, stage unspecified: Secondary | ICD-10-CM | POA: Diagnosis not present

## 2017-07-02 DIAGNOSIS — Z741 Need for assistance with personal care: Secondary | ICD-10-CM | POA: Diagnosis not present

## 2017-07-02 DIAGNOSIS — Z66 Do not resuscitate: Secondary | ICD-10-CM | POA: Diagnosis not present

## 2017-07-02 DIAGNOSIS — J180 Bronchopneumonia, unspecified organism: Secondary | ICD-10-CM | POA: Diagnosis not present

## 2017-07-02 DIAGNOSIS — E785 Hyperlipidemia, unspecified: Secondary | ICD-10-CM | POA: Diagnosis present

## 2017-07-02 DIAGNOSIS — R402441 Other coma, without documented Glasgow coma scale score, or with partial score reported, in the field [EMT or ambulance]: Secondary | ICD-10-CM | POA: Diagnosis not present

## 2017-07-02 DIAGNOSIS — M2550 Pain in unspecified joint: Secondary | ICD-10-CM | POA: Diagnosis not present

## 2017-07-02 DIAGNOSIS — R41841 Cognitive communication deficit: Secondary | ICD-10-CM | POA: Diagnosis not present

## 2017-07-02 DIAGNOSIS — L89629 Pressure ulcer of left heel, unspecified stage: Secondary | ICD-10-CM | POA: Diagnosis not present

## 2017-07-02 DIAGNOSIS — B967 Clostridium perfringens [C. perfringens] as the cause of diseases classified elsewhere: Secondary | ICD-10-CM | POA: Diagnosis not present

## 2017-07-02 DIAGNOSIS — Z7401 Bed confinement status: Secondary | ICD-10-CM | POA: Diagnosis not present

## 2017-07-02 DIAGNOSIS — R269 Unspecified abnormalities of gait and mobility: Secondary | ICD-10-CM | POA: Diagnosis present

## 2017-07-02 DIAGNOSIS — H409 Unspecified glaucoma: Secondary | ICD-10-CM | POA: Diagnosis present

## 2017-07-02 DIAGNOSIS — K567 Ileus, unspecified: Secondary | ICD-10-CM | POA: Diagnosis not present

## 2017-07-02 DIAGNOSIS — E860 Dehydration: Secondary | ICD-10-CM | POA: Diagnosis present

## 2017-07-02 DIAGNOSIS — E876 Hypokalemia: Secondary | ICD-10-CM | POA: Diagnosis present

## 2017-07-02 DIAGNOSIS — H44513 Absolute glaucoma, bilateral: Secondary | ICD-10-CM | POA: Diagnosis not present

## 2017-07-02 DIAGNOSIS — A419 Sepsis, unspecified organism: Secondary | ICD-10-CM | POA: Diagnosis not present

## 2017-07-02 DIAGNOSIS — D638 Anemia in other chronic diseases classified elsewhere: Secondary | ICD-10-CM | POA: Diagnosis not present

## 2017-07-02 DIAGNOSIS — G9341 Metabolic encephalopathy: Secondary | ICD-10-CM | POA: Diagnosis not present

## 2017-07-02 DIAGNOSIS — G301 Alzheimer's disease with late onset: Secondary | ICD-10-CM | POA: Diagnosis not present

## 2017-07-06 ENCOUNTER — Telehealth: Payer: Self-pay | Admitting: Family Medicine

## 2017-07-06 NOTE — Telephone Encounter (Signed)
Left message for patient to return my call for an appt.

## 2017-07-13 DIAGNOSIS — G9341 Metabolic encephalopathy: Secondary | ICD-10-CM | POA: Diagnosis not present

## 2017-07-13 DIAGNOSIS — R197 Diarrhea, unspecified: Secondary | ICD-10-CM | POA: Diagnosis not present

## 2017-07-13 DIAGNOSIS — B351 Tinea unguium: Secondary | ICD-10-CM | POA: Diagnosis not present

## 2017-07-13 DIAGNOSIS — N3289 Other specified disorders of bladder: Secondary | ICD-10-CM | POA: Diagnosis not present

## 2017-07-13 DIAGNOSIS — E860 Dehydration: Secondary | ICD-10-CM | POA: Diagnosis not present

## 2017-07-13 DIAGNOSIS — M10032 Idiopathic gout, left wrist: Secondary | ICD-10-CM | POA: Diagnosis not present

## 2017-07-13 DIAGNOSIS — M810 Age-related osteoporosis without current pathological fracture: Secondary | ICD-10-CM | POA: Diagnosis not present

## 2017-07-13 DIAGNOSIS — L853 Xerosis cutis: Secondary | ICD-10-CM | POA: Diagnosis not present

## 2017-07-13 DIAGNOSIS — J322 Chronic ethmoidal sinusitis: Secondary | ICD-10-CM | POA: Diagnosis not present

## 2017-07-13 DIAGNOSIS — Z9049 Acquired absence of other specified parts of digestive tract: Secondary | ICD-10-CM | POA: Diagnosis not present

## 2017-07-13 DIAGNOSIS — E440 Moderate protein-calorie malnutrition: Secondary | ICD-10-CM | POA: Diagnosis not present

## 2017-07-13 DIAGNOSIS — Z7401 Bed confinement status: Secondary | ICD-10-CM | POA: Diagnosis not present

## 2017-07-13 DIAGNOSIS — R54 Age-related physical debility: Secondary | ICD-10-CM | POA: Diagnosis not present

## 2017-07-13 DIAGNOSIS — G301 Alzheimer's disease with late onset: Secondary | ICD-10-CM | POA: Diagnosis not present

## 2017-07-13 DIAGNOSIS — Z8619 Personal history of other infectious and parasitic diseases: Secondary | ICD-10-CM | POA: Diagnosis not present

## 2017-07-13 DIAGNOSIS — E870 Hyperosmolality and hypernatremia: Secondary | ICD-10-CM | POA: Diagnosis not present

## 2017-07-13 DIAGNOSIS — L959 Vasculitis limited to the skin, unspecified: Secondary | ICD-10-CM | POA: Diagnosis not present

## 2017-07-13 DIAGNOSIS — Y9289 Other specified places as the place of occurrence of the external cause: Secondary | ICD-10-CM | POA: Diagnosis not present

## 2017-07-13 DIAGNOSIS — R600 Localized edema: Secondary | ICD-10-CM | POA: Diagnosis not present

## 2017-07-13 DIAGNOSIS — D72829 Elevated white blood cell count, unspecified: Secondary | ICD-10-CM | POA: Diagnosis not present

## 2017-07-13 DIAGNOSIS — R269 Unspecified abnormalities of gait and mobility: Secondary | ICD-10-CM | POA: Diagnosis not present

## 2017-07-13 DIAGNOSIS — F0280 Dementia in other diseases classified elsewhere without behavioral disturbance: Secondary | ICD-10-CM | POA: Diagnosis not present

## 2017-07-13 DIAGNOSIS — Z741 Need for assistance with personal care: Secondary | ICD-10-CM | POA: Diagnosis not present

## 2017-07-13 DIAGNOSIS — B967 Clostridium perfringens [C. perfringens] as the cause of diseases classified elsewhere: Secondary | ICD-10-CM | POA: Diagnosis not present

## 2017-07-13 DIAGNOSIS — S0090XD Unspecified superficial injury of unspecified part of head, subsequent encounter: Secondary | ICD-10-CM | POA: Diagnosis not present

## 2017-07-13 DIAGNOSIS — R1312 Dysphagia, oropharyngeal phase: Secondary | ICD-10-CM | POA: Diagnosis not present

## 2017-07-13 DIAGNOSIS — A0472 Enterocolitis due to Clostridium difficile, not specified as recurrent: Secondary | ICD-10-CM | POA: Diagnosis not present

## 2017-07-13 DIAGNOSIS — R2689 Other abnormalities of gait and mobility: Secondary | ICD-10-CM | POA: Diagnosis not present

## 2017-07-13 DIAGNOSIS — S0990XA Unspecified injury of head, initial encounter: Secondary | ICD-10-CM | POA: Diagnosis not present

## 2017-07-13 DIAGNOSIS — W050XXA Fall from non-moving wheelchair, initial encounter: Secondary | ICD-10-CM | POA: Diagnosis not present

## 2017-07-13 DIAGNOSIS — R7989 Other specified abnormal findings of blood chemistry: Secondary | ICD-10-CM | POA: Diagnosis not present

## 2017-07-13 DIAGNOSIS — E559 Vitamin D deficiency, unspecified: Secondary | ICD-10-CM | POA: Diagnosis not present

## 2017-07-13 DIAGNOSIS — M2010 Hallux valgus (acquired), unspecified foot: Secondary | ICD-10-CM | POA: Diagnosis not present

## 2017-07-13 DIAGNOSIS — Z87891 Personal history of nicotine dependence: Secondary | ICD-10-CM | POA: Diagnosis not present

## 2017-07-13 DIAGNOSIS — M5030 Other cervical disc degeneration, unspecified cervical region: Secondary | ICD-10-CM | POA: Diagnosis not present

## 2017-07-13 DIAGNOSIS — M150 Primary generalized (osteo)arthritis: Secondary | ICD-10-CM | POA: Diagnosis not present

## 2017-07-13 DIAGNOSIS — R630 Anorexia: Secondary | ICD-10-CM | POA: Diagnosis not present

## 2017-07-13 DIAGNOSIS — H409 Unspecified glaucoma: Secondary | ICD-10-CM | POA: Diagnosis not present

## 2017-07-13 DIAGNOSIS — R4182 Altered mental status, unspecified: Secondary | ICD-10-CM | POA: Diagnosis not present

## 2017-07-13 DIAGNOSIS — R41844 Frontal lobe and executive function deficit: Secondary | ICD-10-CM | POA: Diagnosis not present

## 2017-07-13 DIAGNOSIS — R627 Adult failure to thrive: Secondary | ICD-10-CM | POA: Diagnosis not present

## 2017-07-13 DIAGNOSIS — Z87440 Personal history of urinary (tract) infections: Secondary | ICD-10-CM | POA: Diagnosis not present

## 2017-07-13 DIAGNOSIS — Z79899 Other long term (current) drug therapy: Secondary | ICD-10-CM | POA: Diagnosis not present

## 2017-07-13 DIAGNOSIS — L603 Nail dystrophy: Secondary | ICD-10-CM | POA: Diagnosis not present

## 2017-07-13 DIAGNOSIS — E785 Hyperlipidemia, unspecified: Secondary | ICD-10-CM | POA: Diagnosis not present

## 2017-07-13 DIAGNOSIS — M79642 Pain in left hand: Secondary | ICD-10-CM | POA: Diagnosis not present

## 2017-07-13 DIAGNOSIS — L89629 Pressure ulcer of left heel, unspecified stage: Secondary | ICD-10-CM | POA: Diagnosis not present

## 2017-07-13 DIAGNOSIS — H4010X0 Unspecified open-angle glaucoma, stage unspecified: Secondary | ICD-10-CM | POA: Diagnosis not present

## 2017-07-13 DIAGNOSIS — L22 Diaper dermatitis: Secondary | ICD-10-CM | POA: Diagnosis not present

## 2017-07-13 DIAGNOSIS — M10042 Idiopathic gout, left hand: Secondary | ICD-10-CM | POA: Diagnosis not present

## 2017-07-13 DIAGNOSIS — G9340 Encephalopathy, unspecified: Secondary | ICD-10-CM | POA: Diagnosis not present

## 2017-07-13 DIAGNOSIS — A09 Infectious gastroenteritis and colitis, unspecified: Secondary | ICD-10-CM | POA: Diagnosis not present

## 2017-07-13 DIAGNOSIS — R634 Abnormal weight loss: Secondary | ICD-10-CM | POA: Diagnosis not present

## 2017-07-13 DIAGNOSIS — G309 Alzheimer's disease, unspecified: Secondary | ICD-10-CM | POA: Diagnosis not present

## 2017-07-13 DIAGNOSIS — J180 Bronchopneumonia, unspecified organism: Secondary | ICD-10-CM | POA: Diagnosis not present

## 2017-07-13 DIAGNOSIS — R41841 Cognitive communication deficit: Secondary | ICD-10-CM | POA: Diagnosis not present

## 2017-07-13 DIAGNOSIS — F0281 Dementia in other diseases classified elsewhere with behavioral disturbance: Secondary | ICD-10-CM | POA: Diagnosis not present

## 2017-07-13 DIAGNOSIS — D72828 Other elevated white blood cell count: Secondary | ICD-10-CM | POA: Diagnosis not present

## 2017-07-13 DIAGNOSIS — A419 Sepsis, unspecified organism: Secondary | ICD-10-CM | POA: Diagnosis not present

## 2017-07-13 DIAGNOSIS — E43 Unspecified severe protein-calorie malnutrition: Secondary | ICD-10-CM | POA: Diagnosis not present

## 2017-07-13 DIAGNOSIS — L89152 Pressure ulcer of sacral region, stage 2: Secondary | ICD-10-CM | POA: Diagnosis not present

## 2017-07-13 DIAGNOSIS — S0181XA Laceration without foreign body of other part of head, initial encounter: Secondary | ICD-10-CM | POA: Diagnosis not present

## 2017-07-13 DIAGNOSIS — I739 Peripheral vascular disease, unspecified: Secondary | ICD-10-CM | POA: Diagnosis not present

## 2017-07-13 DIAGNOSIS — N179 Acute kidney failure, unspecified: Secondary | ICD-10-CM | POA: Diagnosis not present

## 2017-07-13 DIAGNOSIS — Z043 Encounter for examination and observation following other accident: Secondary | ICD-10-CM | POA: Diagnosis not present

## 2017-07-13 DIAGNOSIS — R609 Edema, unspecified: Secondary | ICD-10-CM | POA: Diagnosis not present

## 2017-07-13 DIAGNOSIS — H44513 Absolute glaucoma, bilateral: Secondary | ICD-10-CM | POA: Diagnosis not present

## 2017-07-13 DIAGNOSIS — D638 Anemia in other chronic diseases classified elsewhere: Secondary | ICD-10-CM | POA: Diagnosis not present

## 2017-07-13 DIAGNOSIS — D649 Anemia, unspecified: Secondary | ICD-10-CM | POA: Diagnosis not present

## 2017-07-13 DIAGNOSIS — M2550 Pain in unspecified joint: Secondary | ICD-10-CM | POA: Diagnosis not present

## 2017-07-13 DIAGNOSIS — Q845 Enlarged and hypertrophic nails: Secondary | ICD-10-CM | POA: Diagnosis not present

## 2017-07-13 DIAGNOSIS — M6281 Muscle weakness (generalized): Secondary | ICD-10-CM | POA: Diagnosis not present

## 2017-07-13 DIAGNOSIS — R402441 Other coma, without documented Glasgow coma scale score, or with partial score reported, in the field [EMT or ambulance]: Secondary | ICD-10-CM | POA: Diagnosis not present

## 2017-07-13 DIAGNOSIS — N3281 Overactive bladder: Secondary | ICD-10-CM | POA: Diagnosis not present

## 2017-07-14 DIAGNOSIS — R627 Adult failure to thrive: Secondary | ICD-10-CM | POA: Diagnosis not present

## 2017-07-14 DIAGNOSIS — M810 Age-related osteoporosis without current pathological fracture: Secondary | ICD-10-CM | POA: Diagnosis not present

## 2017-07-14 DIAGNOSIS — H409 Unspecified glaucoma: Secondary | ICD-10-CM | POA: Diagnosis not present

## 2017-07-14 DIAGNOSIS — G309 Alzheimer's disease, unspecified: Secondary | ICD-10-CM | POA: Diagnosis not present

## 2017-07-14 DIAGNOSIS — E559 Vitamin D deficiency, unspecified: Secondary | ICD-10-CM | POA: Diagnosis not present

## 2017-07-14 DIAGNOSIS — D72829 Elevated white blood cell count, unspecified: Secondary | ICD-10-CM | POA: Diagnosis not present

## 2017-07-14 DIAGNOSIS — R54 Age-related physical debility: Secondary | ICD-10-CM | POA: Diagnosis not present

## 2017-07-14 DIAGNOSIS — R2689 Other abnormalities of gait and mobility: Secondary | ICD-10-CM | POA: Diagnosis not present

## 2017-07-14 DIAGNOSIS — A0472 Enterocolitis due to Clostridium difficile, not specified as recurrent: Secondary | ICD-10-CM | POA: Diagnosis not present

## 2017-07-14 DIAGNOSIS — M6281 Muscle weakness (generalized): Secondary | ICD-10-CM | POA: Diagnosis not present

## 2017-07-16 DIAGNOSIS — B351 Tinea unguium: Secondary | ICD-10-CM | POA: Diagnosis not present

## 2017-07-16 DIAGNOSIS — L853 Xerosis cutis: Secondary | ICD-10-CM | POA: Diagnosis not present

## 2017-07-16 DIAGNOSIS — M2010 Hallux valgus (acquired), unspecified foot: Secondary | ICD-10-CM | POA: Diagnosis not present

## 2017-07-16 DIAGNOSIS — L603 Nail dystrophy: Secondary | ICD-10-CM | POA: Diagnosis not present

## 2017-07-16 DIAGNOSIS — R600 Localized edema: Secondary | ICD-10-CM | POA: Diagnosis not present

## 2017-07-16 DIAGNOSIS — I739 Peripheral vascular disease, unspecified: Secondary | ICD-10-CM | POA: Diagnosis not present

## 2017-07-16 DIAGNOSIS — Q845 Enlarged and hypertrophic nails: Secondary | ICD-10-CM | POA: Diagnosis not present

## 2017-07-18 DIAGNOSIS — Y9289 Other specified places as the place of occurrence of the external cause: Secondary | ICD-10-CM | POA: Diagnosis not present

## 2017-07-18 DIAGNOSIS — J322 Chronic ethmoidal sinusitis: Secondary | ICD-10-CM | POA: Diagnosis not present

## 2017-07-18 DIAGNOSIS — M5030 Other cervical disc degeneration, unspecified cervical region: Secondary | ICD-10-CM | POA: Diagnosis not present

## 2017-07-18 DIAGNOSIS — F0280 Dementia in other diseases classified elsewhere without behavioral disturbance: Secondary | ICD-10-CM | POA: Diagnosis not present

## 2017-07-18 DIAGNOSIS — W050XXA Fall from non-moving wheelchair, initial encounter: Secondary | ICD-10-CM | POA: Diagnosis not present

## 2017-07-18 DIAGNOSIS — G309 Alzheimer's disease, unspecified: Secondary | ICD-10-CM | POA: Diagnosis not present

## 2017-07-18 DIAGNOSIS — S0181XA Laceration without foreign body of other part of head, initial encounter: Secondary | ICD-10-CM | POA: Diagnosis not present

## 2017-07-20 DIAGNOSIS — G309 Alzheimer's disease, unspecified: Secondary | ICD-10-CM | POA: Diagnosis not present

## 2017-07-20 DIAGNOSIS — D72829 Elevated white blood cell count, unspecified: Secondary | ICD-10-CM | POA: Diagnosis not present

## 2017-07-20 DIAGNOSIS — S0090XD Unspecified superficial injury of unspecified part of head, subsequent encounter: Secondary | ICD-10-CM | POA: Diagnosis not present

## 2017-07-20 DIAGNOSIS — M6281 Muscle weakness (generalized): Secondary | ICD-10-CM | POA: Diagnosis not present

## 2017-07-20 DIAGNOSIS — R54 Age-related physical debility: Secondary | ICD-10-CM | POA: Diagnosis not present

## 2017-07-20 DIAGNOSIS — E559 Vitamin D deficiency, unspecified: Secondary | ICD-10-CM | POA: Diagnosis not present

## 2017-07-20 DIAGNOSIS — R627 Adult failure to thrive: Secondary | ICD-10-CM | POA: Diagnosis not present

## 2017-07-20 DIAGNOSIS — H409 Unspecified glaucoma: Secondary | ICD-10-CM | POA: Diagnosis not present

## 2017-07-20 DIAGNOSIS — A0472 Enterocolitis due to Clostridium difficile, not specified as recurrent: Secondary | ICD-10-CM | POA: Diagnosis not present

## 2017-07-20 DIAGNOSIS — M810 Age-related osteoporosis without current pathological fracture: Secondary | ICD-10-CM | POA: Diagnosis not present

## 2017-07-20 DIAGNOSIS — M150 Primary generalized (osteo)arthritis: Secondary | ICD-10-CM | POA: Diagnosis not present

## 2017-07-21 DIAGNOSIS — L89152 Pressure ulcer of sacral region, stage 2: Secondary | ICD-10-CM | POA: Diagnosis not present

## 2017-07-28 DIAGNOSIS — E785 Hyperlipidemia, unspecified: Secondary | ICD-10-CM | POA: Diagnosis not present

## 2017-07-28 DIAGNOSIS — R7989 Other specified abnormal findings of blood chemistry: Secondary | ICD-10-CM | POA: Diagnosis not present

## 2017-07-28 DIAGNOSIS — D649 Anemia, unspecified: Secondary | ICD-10-CM | POA: Diagnosis not present

## 2017-07-28 DIAGNOSIS — R4182 Altered mental status, unspecified: Secondary | ICD-10-CM | POA: Diagnosis not present

## 2017-07-28 DIAGNOSIS — N179 Acute kidney failure, unspecified: Secondary | ICD-10-CM | POA: Diagnosis not present

## 2017-07-28 DIAGNOSIS — R609 Edema, unspecified: Secondary | ICD-10-CM | POA: Diagnosis not present

## 2017-07-28 DIAGNOSIS — L89152 Pressure ulcer of sacral region, stage 2: Secondary | ICD-10-CM | POA: Diagnosis not present

## 2017-07-28 DIAGNOSIS — G309 Alzheimer's disease, unspecified: Secondary | ICD-10-CM | POA: Diagnosis not present

## 2017-07-28 DIAGNOSIS — D72828 Other elevated white blood cell count: Secondary | ICD-10-CM | POA: Diagnosis not present

## 2017-07-28 DIAGNOSIS — M6281 Muscle weakness (generalized): Secondary | ICD-10-CM | POA: Diagnosis not present

## 2017-07-29 DIAGNOSIS — G301 Alzheimer's disease with late onset: Secondary | ICD-10-CM | POA: Diagnosis not present

## 2017-07-29 DIAGNOSIS — F0280 Dementia in other diseases classified elsewhere without behavioral disturbance: Secondary | ICD-10-CM | POA: Diagnosis not present

## 2017-08-04 DIAGNOSIS — L89152 Pressure ulcer of sacral region, stage 2: Secondary | ICD-10-CM | POA: Diagnosis not present

## 2017-08-05 DIAGNOSIS — D72829 Elevated white blood cell count, unspecified: Secondary | ICD-10-CM | POA: Diagnosis not present

## 2017-08-05 DIAGNOSIS — M150 Primary generalized (osteo)arthritis: Secondary | ICD-10-CM | POA: Diagnosis not present

## 2017-08-05 DIAGNOSIS — S0090XD Unspecified superficial injury of unspecified part of head, subsequent encounter: Secondary | ICD-10-CM | POA: Diagnosis not present

## 2017-08-05 DIAGNOSIS — G309 Alzheimer's disease, unspecified: Secondary | ICD-10-CM | POA: Diagnosis not present

## 2017-08-05 DIAGNOSIS — M6281 Muscle weakness (generalized): Secondary | ICD-10-CM | POA: Diagnosis not present

## 2017-08-05 DIAGNOSIS — A0472 Enterocolitis due to Clostridium difficile, not specified as recurrent: Secondary | ICD-10-CM | POA: Diagnosis not present

## 2017-08-05 DIAGNOSIS — E559 Vitamin D deficiency, unspecified: Secondary | ICD-10-CM | POA: Diagnosis not present

## 2017-08-05 DIAGNOSIS — R627 Adult failure to thrive: Secondary | ICD-10-CM | POA: Diagnosis not present

## 2017-08-05 DIAGNOSIS — M810 Age-related osteoporosis without current pathological fracture: Secondary | ICD-10-CM | POA: Diagnosis not present

## 2017-08-05 DIAGNOSIS — H409 Unspecified glaucoma: Secondary | ICD-10-CM | POA: Diagnosis not present

## 2017-08-05 DIAGNOSIS — R54 Age-related physical debility: Secondary | ICD-10-CM | POA: Diagnosis not present

## 2017-08-11 DIAGNOSIS — L89152 Pressure ulcer of sacral region, stage 2: Secondary | ICD-10-CM | POA: Diagnosis not present

## 2017-08-12 DIAGNOSIS — F0280 Dementia in other diseases classified elsewhere without behavioral disturbance: Secondary | ICD-10-CM | POA: Diagnosis not present

## 2017-08-12 DIAGNOSIS — G309 Alzheimer's disease, unspecified: Secondary | ICD-10-CM | POA: Diagnosis not present

## 2017-08-12 DIAGNOSIS — R630 Anorexia: Secondary | ICD-10-CM | POA: Diagnosis not present

## 2017-08-12 DIAGNOSIS — M6281 Muscle weakness (generalized): Secondary | ICD-10-CM | POA: Diagnosis not present

## 2017-08-12 DIAGNOSIS — R197 Diarrhea, unspecified: Secondary | ICD-10-CM | POA: Diagnosis not present

## 2017-08-12 DIAGNOSIS — G301 Alzheimer's disease with late onset: Secondary | ICD-10-CM | POA: Diagnosis not present

## 2017-08-12 DIAGNOSIS — R54 Age-related physical debility: Secondary | ICD-10-CM | POA: Diagnosis not present

## 2017-08-12 DIAGNOSIS — R627 Adult failure to thrive: Secondary | ICD-10-CM | POA: Diagnosis not present

## 2017-08-18 DIAGNOSIS — L22 Diaper dermatitis: Secondary | ICD-10-CM | POA: Diagnosis not present

## 2017-08-24 DIAGNOSIS — L959 Vasculitis limited to the skin, unspecified: Secondary | ICD-10-CM | POA: Diagnosis not present

## 2017-08-24 DIAGNOSIS — M79642 Pain in left hand: Secondary | ICD-10-CM | POA: Diagnosis not present

## 2017-08-26 DIAGNOSIS — L959 Vasculitis limited to the skin, unspecified: Secondary | ICD-10-CM | POA: Diagnosis not present

## 2017-08-26 DIAGNOSIS — E870 Hyperosmolality and hypernatremia: Secondary | ICD-10-CM | POA: Diagnosis not present

## 2017-08-26 DIAGNOSIS — M10042 Idiopathic gout, left hand: Secondary | ICD-10-CM | POA: Diagnosis not present

## 2017-08-31 DIAGNOSIS — L959 Vasculitis limited to the skin, unspecified: Secondary | ICD-10-CM | POA: Diagnosis not present

## 2017-08-31 DIAGNOSIS — M10032 Idiopathic gout, left wrist: Secondary | ICD-10-CM | POA: Diagnosis not present

## 2017-08-31 DIAGNOSIS — R627 Adult failure to thrive: Secondary | ICD-10-CM | POA: Diagnosis not present

## 2017-08-31 DIAGNOSIS — G309 Alzheimer's disease, unspecified: Secondary | ICD-10-CM | POA: Diagnosis not present

## 2017-08-31 DIAGNOSIS — E870 Hyperosmolality and hypernatremia: Secondary | ICD-10-CM | POA: Diagnosis not present

## 2017-09-02 DIAGNOSIS — G309 Alzheimer's disease, unspecified: Secondary | ICD-10-CM | POA: Diagnosis not present

## 2017-09-02 DIAGNOSIS — E870 Hyperosmolality and hypernatremia: Secondary | ICD-10-CM | POA: Diagnosis not present

## 2017-09-02 DIAGNOSIS — R627 Adult failure to thrive: Secondary | ICD-10-CM | POA: Diagnosis not present

## 2017-09-02 DIAGNOSIS — L959 Vasculitis limited to the skin, unspecified: Secondary | ICD-10-CM | POA: Diagnosis not present

## 2017-09-02 DIAGNOSIS — R634 Abnormal weight loss: Secondary | ICD-10-CM | POA: Diagnosis not present

## 2017-09-02 DIAGNOSIS — M10032 Idiopathic gout, left wrist: Secondary | ICD-10-CM | POA: Diagnosis not present

## 2017-09-06 DIAGNOSIS — R54 Age-related physical debility: Secondary | ICD-10-CM | POA: Diagnosis not present

## 2017-09-06 DIAGNOSIS — R627 Adult failure to thrive: Secondary | ICD-10-CM | POA: Diagnosis not present

## 2017-09-07 DIAGNOSIS — G309 Alzheimer's disease, unspecified: Secondary | ICD-10-CM | POA: Diagnosis not present

## 2017-09-07 DIAGNOSIS — R627 Adult failure to thrive: Secondary | ICD-10-CM | POA: Diagnosis not present

## 2017-09-30 DEATH — deceased

## 2017-12-16 ENCOUNTER — Telehealth: Payer: Self-pay

## 2017-12-21 NOTE — Telephone Encounter (Signed)
Noted, thank you.  -MM 

## 2017-12-31 NOTE — Telephone Encounter (Signed)
LM for pt to CB and schedule AWV. Last AWV was 01/13/17. -MM

## 2017-12-31 NOTE — Telephone Encounter (Signed)
Pt's wife returned call advising pt passed away. Thanks TNP

## 2017-12-31 DEATH — deceased
